# Patient Record
Sex: Female | Born: 1955
Health system: Southern US, Community
[De-identification: ages and names within clinical notes are randomized; demographics above are authoritative.]

## PROBLEM LIST (undated history)

## (undated) DIAGNOSIS — E785 Hyperlipidemia, unspecified: Secondary | ICD-10-CM

## (undated) DIAGNOSIS — M1990 Unspecified osteoarthritis, unspecified site: Secondary | ICD-10-CM

## (undated) DIAGNOSIS — E119 Type 2 diabetes mellitus without complications: Secondary | ICD-10-CM

## (undated) DIAGNOSIS — T7840XA Allergy, unspecified, initial encounter: Secondary | ICD-10-CM

## (undated) DIAGNOSIS — Z9109 Other allergy status, other than to drugs and biological substances: Secondary | ICD-10-CM

## (undated) DIAGNOSIS — I10 Essential (primary) hypertension: Secondary | ICD-10-CM

## (undated) DIAGNOSIS — K589 Irritable bowel syndrome without diarrhea: Secondary | ICD-10-CM

## (undated) DIAGNOSIS — J45909 Unspecified asthma, uncomplicated: Secondary | ICD-10-CM

## (undated) DIAGNOSIS — K219 Gastro-esophageal reflux disease without esophagitis: Secondary | ICD-10-CM

## (undated) DIAGNOSIS — E669 Obesity, unspecified: Secondary | ICD-10-CM

## (undated) HISTORY — DX: Essential (primary) hypertension: I10

## (undated) HISTORY — DX: Gastro-esophageal reflux disease without esophagitis: K21.9

## (undated) HISTORY — DX: Allergy, unspecified, initial encounter: T78.40XA

## (undated) HISTORY — DX: Irritable bowel syndrome, unspecified: K58.9

## (undated) HISTORY — PX: TUBAL LIGATION: SHX77

## (undated) HISTORY — PX: COLONOSCOPY: SHX174

## (undated) HISTORY — PX: ABDOMINAL HYSTERECTOMY: SHX81

## (undated) HISTORY — DX: Obesity, unspecified: E66.9

## (undated) HISTORY — DX: Unspecified asthma, uncomplicated: J45.909

## (undated) HISTORY — DX: Other allergy status, other than to drugs and biological substances: Z91.09

## (undated) HISTORY — DX: Hyperlipidemia, unspecified: E78.5

## (undated) HISTORY — DX: Unspecified osteoarthritis, unspecified site: M19.90

## (undated) HISTORY — DX: Type 2 diabetes mellitus without complications: E11.9

---

## 1998-03-24 ENCOUNTER — Encounter: Payer: Self-pay | Admitting: Internal Medicine

## 1998-03-24 ENCOUNTER — Ambulatory Visit (HOSPITAL_COMMUNITY): Admission: RE | Admit: 1998-03-24 | Discharge: 1998-03-24 | Payer: Self-pay | Admitting: Internal Medicine

## 1998-05-06 ENCOUNTER — Emergency Department (HOSPITAL_COMMUNITY): Admission: EM | Admit: 1998-05-06 | Discharge: 1998-05-07 | Payer: Self-pay | Admitting: Emergency Medicine

## 1998-06-15 ENCOUNTER — Other Ambulatory Visit: Admission: RE | Admit: 1998-06-15 | Discharge: 1998-06-15 | Payer: Self-pay | Admitting: Gynecology

## 1998-11-14 ENCOUNTER — Encounter: Admission: RE | Admit: 1998-11-14 | Discharge: 1999-02-12 | Payer: Self-pay | Admitting: Internal Medicine

## 1999-06-18 ENCOUNTER — Other Ambulatory Visit: Admission: RE | Admit: 1999-06-18 | Discharge: 1999-06-18 | Payer: Self-pay | Admitting: Gynecology

## 1999-06-20 ENCOUNTER — Encounter: Payer: Self-pay | Admitting: Gynecology

## 1999-06-20 ENCOUNTER — Encounter: Admission: RE | Admit: 1999-06-20 | Discharge: 1999-06-20 | Payer: Self-pay | Admitting: Gynecology

## 2000-06-18 ENCOUNTER — Other Ambulatory Visit: Admission: RE | Admit: 2000-06-18 | Discharge: 2000-06-18 | Payer: Self-pay | Admitting: Gynecology

## 2000-06-27 ENCOUNTER — Encounter: Payer: Self-pay | Admitting: Gynecology

## 2000-06-27 ENCOUNTER — Encounter: Admission: RE | Admit: 2000-06-27 | Discharge: 2000-06-27 | Payer: Self-pay | Admitting: Gynecology

## 2000-12-26 ENCOUNTER — Ambulatory Visit (HOSPITAL_COMMUNITY): Admission: RE | Admit: 2000-12-26 | Discharge: 2000-12-26 | Payer: Self-pay | Admitting: Gastroenterology

## 2001-04-23 ENCOUNTER — Encounter (INDEPENDENT_AMBULATORY_CARE_PROVIDER_SITE_OTHER): Payer: Self-pay

## 2001-04-23 ENCOUNTER — Ambulatory Visit (HOSPITAL_COMMUNITY): Admission: RE | Admit: 2001-04-23 | Discharge: 2001-04-23 | Payer: Self-pay | Admitting: Gastroenterology

## 2001-05-14 ENCOUNTER — Encounter: Admission: RE | Admit: 2001-05-14 | Discharge: 2001-05-14 | Payer: Self-pay | Admitting: Gynecology

## 2001-05-14 ENCOUNTER — Encounter: Payer: Self-pay | Admitting: Gynecology

## 2001-07-20 ENCOUNTER — Other Ambulatory Visit: Admission: RE | Admit: 2001-07-20 | Discharge: 2001-07-20 | Payer: Self-pay | Admitting: Gynecology

## 2001-09-28 ENCOUNTER — Encounter: Payer: Self-pay | Admitting: Internal Medicine

## 2001-09-28 ENCOUNTER — Encounter: Admission: RE | Admit: 2001-09-28 | Discharge: 2001-09-28 | Payer: Self-pay | Admitting: Internal Medicine

## 2002-02-24 ENCOUNTER — Emergency Department (HOSPITAL_COMMUNITY): Admission: EM | Admit: 2002-02-24 | Discharge: 2002-02-24 | Payer: Self-pay | Admitting: Nurse Practitioner

## 2002-02-24 ENCOUNTER — Encounter: Payer: Self-pay | Admitting: Emergency Medicine

## 2002-06-01 ENCOUNTER — Encounter: Admission: RE | Admit: 2002-06-01 | Discharge: 2002-06-01 | Payer: Self-pay | Admitting: Gynecology

## 2002-06-01 ENCOUNTER — Encounter: Payer: Self-pay | Admitting: Gynecology

## 2002-09-14 ENCOUNTER — Other Ambulatory Visit: Admission: RE | Admit: 2002-09-14 | Discharge: 2002-09-14 | Payer: Self-pay | Admitting: Gynecology

## 2003-06-17 ENCOUNTER — Encounter: Admission: RE | Admit: 2003-06-17 | Discharge: 2003-06-17 | Payer: Self-pay | Admitting: Gynecology

## 2003-10-04 ENCOUNTER — Other Ambulatory Visit: Admission: RE | Admit: 2003-10-04 | Discharge: 2003-10-04 | Payer: Self-pay | Admitting: Gynecology

## 2004-04-23 ENCOUNTER — Encounter: Admission: RE | Admit: 2004-04-23 | Discharge: 2004-04-23 | Payer: Self-pay | Admitting: Internal Medicine

## 2004-07-16 ENCOUNTER — Encounter: Admission: RE | Admit: 2004-07-16 | Discharge: 2004-07-16 | Payer: Self-pay | Admitting: Gynecology

## 2004-10-30 ENCOUNTER — Other Ambulatory Visit: Admission: RE | Admit: 2004-10-30 | Discharge: 2004-10-30 | Payer: Self-pay | Admitting: Gynecology

## 2005-02-09 ENCOUNTER — Emergency Department (HOSPITAL_COMMUNITY): Admission: EM | Admit: 2005-02-09 | Discharge: 2005-02-10 | Payer: Self-pay | Admitting: Emergency Medicine

## 2005-02-13 ENCOUNTER — Emergency Department (HOSPITAL_COMMUNITY): Admission: EM | Admit: 2005-02-13 | Discharge: 2005-02-14 | Payer: Self-pay | Admitting: Emergency Medicine

## 2005-03-07 ENCOUNTER — Ambulatory Visit (HOSPITAL_COMMUNITY): Admission: RE | Admit: 2005-03-07 | Discharge: 2005-03-07 | Payer: Self-pay | Admitting: Internal Medicine

## 2005-07-24 ENCOUNTER — Encounter: Admission: RE | Admit: 2005-07-24 | Discharge: 2005-07-24 | Payer: Self-pay | Admitting: Gynecology

## 2006-01-08 ENCOUNTER — Other Ambulatory Visit: Admission: RE | Admit: 2006-01-08 | Discharge: 2006-01-08 | Payer: Self-pay | Admitting: Gynecology

## 2006-07-25 ENCOUNTER — Encounter: Admission: RE | Admit: 2006-07-25 | Discharge: 2006-07-25 | Payer: Self-pay | Admitting: Gynecology

## 2007-01-13 ENCOUNTER — Other Ambulatory Visit: Admission: RE | Admit: 2007-01-13 | Discharge: 2007-01-13 | Payer: Self-pay | Admitting: Gynecology

## 2007-02-22 ENCOUNTER — Emergency Department (HOSPITAL_COMMUNITY): Admission: EM | Admit: 2007-02-22 | Discharge: 2007-02-22 | Payer: Self-pay | Admitting: Emergency Medicine

## 2007-07-17 ENCOUNTER — Emergency Department (HOSPITAL_COMMUNITY): Admission: EM | Admit: 2007-07-17 | Discharge: 2007-07-17 | Payer: Self-pay | Admitting: Emergency Medicine

## 2007-07-27 ENCOUNTER — Encounter: Admission: RE | Admit: 2007-07-27 | Discharge: 2007-07-27 | Payer: Self-pay | Admitting: Gynecology

## 2007-11-16 ENCOUNTER — Encounter: Admission: RE | Admit: 2007-11-16 | Discharge: 2007-11-16 | Payer: Self-pay | Admitting: Internal Medicine

## 2008-01-09 ENCOUNTER — Emergency Department (HOSPITAL_COMMUNITY): Admission: EM | Admit: 2008-01-09 | Discharge: 2008-01-09 | Payer: Self-pay | Admitting: Emergency Medicine

## 2008-05-04 ENCOUNTER — Other Ambulatory Visit: Admission: RE | Admit: 2008-05-04 | Discharge: 2008-05-04 | Payer: Self-pay | Admitting: Gynecology

## 2008-07-28 ENCOUNTER — Encounter: Admission: RE | Admit: 2008-07-28 | Discharge: 2008-07-28 | Payer: Self-pay | Admitting: Gynecology

## 2008-08-03 ENCOUNTER — Encounter (INDEPENDENT_AMBULATORY_CARE_PROVIDER_SITE_OTHER): Payer: Self-pay | Admitting: Obstetrics and Gynecology

## 2008-08-03 ENCOUNTER — Ambulatory Visit (HOSPITAL_COMMUNITY): Admission: RE | Admit: 2008-08-03 | Discharge: 2008-08-04 | Payer: Self-pay | Admitting: Obstetrics and Gynecology

## 2009-07-29 ENCOUNTER — Emergency Department (HOSPITAL_COMMUNITY): Admission: EM | Admit: 2009-07-29 | Discharge: 2009-07-30 | Payer: Self-pay | Admitting: Emergency Medicine

## 2009-08-02 ENCOUNTER — Encounter: Admission: RE | Admit: 2009-08-02 | Discharge: 2009-08-02 | Payer: Self-pay | Admitting: Internal Medicine

## 2010-07-21 ENCOUNTER — Other Ambulatory Visit: Payer: Self-pay | Admitting: Internal Medicine

## 2010-07-21 DIAGNOSIS — Z1239 Encounter for other screening for malignant neoplasm of breast: Secondary | ICD-10-CM

## 2010-08-17 ENCOUNTER — Ambulatory Visit: Payer: Self-pay

## 2010-08-20 ENCOUNTER — Ambulatory Visit
Admission: RE | Admit: 2010-08-20 | Discharge: 2010-08-20 | Disposition: A | Discharge status: Home / Self Care | Payer: Federal, State, Local not specified - PPO | Source: Ambulatory Visit | Attending: Internal Medicine | Admitting: Internal Medicine

## 2010-08-20 DIAGNOSIS — Z1239 Encounter for other screening for malignant neoplasm of breast: Secondary | ICD-10-CM

## 2010-09-16 LAB — DIFFERENTIAL
Basophils Absolute: 0 10*3/uL (ref 0.0–0.1)
Basophils Relative: 1 % (ref 0–1)
Eosinophils Absolute: 0 10*3/uL (ref 0.0–0.7)
Eosinophils Relative: 1 % (ref 0–5)
Lymphocytes Relative: 24 % (ref 12–46)
Lymphs Abs: 1.8 10*3/uL (ref 0.7–4.0)
Monocytes Absolute: 0.5 10*3/uL (ref 0.1–1.0)
Monocytes Relative: 7 % (ref 3–12)
Neutro Abs: 5.3 10*3/uL (ref 1.7–7.7)
Neutrophils Relative %: 69 % (ref 43–77)

## 2010-09-16 LAB — BASIC METABOLIC PANEL
BUN: 5 mg/dL — ABNORMAL LOW (ref 6–23)
CO2: 26 mEq/L (ref 19–32)
Calcium: 8.9 mg/dL (ref 8.4–10.5)
Chloride: 105 mEq/L (ref 96–112)
Creatinine, Ser: 0.83 mg/dL (ref 0.4–1.2)
GFR calc Af Amer: >60 mL/min (ref >60)
GFR calc non Af Amer: >60 mL/min (ref >60)
Glucose, Bld: 130 mg/dL — ABNORMAL HIGH (ref 70–99)
Potassium: 3.2 mEq/L — ABNORMAL LOW (ref 3.5–5.1)
Sodium: 136 mEq/L (ref 135–145)

## 2010-09-16 LAB — CBC
HCT: 36.8 % (ref 36.0–46.0)
Hemoglobin: 11.8 g/dL — ABNORMAL LOW (ref 12.0–15.0)
MCHC: 32.1 g/dL (ref 30.0–36.0)
MCV: 81.5 fL (ref 78.0–100.0)
Platelets: 321 10*3/uL (ref 150–400)
RBC: 4.51 MIL/uL (ref 3.87–5.11)
RDW: 15.7 % — ABNORMAL HIGH (ref 11.5–15.5)
WBC: 7.8 10*3/uL (ref 4.0–10.5)

## 2010-09-16 LAB — TROPONIN I: Troponin I: 0.01 ng/mL (ref 0.00–0.06)

## 2010-09-16 LAB — D-DIMER, QUANTITATIVE: D-Dimer, Quant: 0.25 ug/mL-FEU (ref 0.00–0.48)

## 2010-10-16 LAB — COMPREHENSIVE METABOLIC PANEL
ALT: 14 U/L (ref 0–35)
AST: 14 U/L (ref 0–37)
Albumin: 3.3 g/dL — ABNORMAL LOW (ref 3.5–5.2)
Alkaline Phosphatase: 77 U/L (ref 39–117)
BUN: 5 mg/dL — ABNORMAL LOW (ref 6–23)
CO2: 27 mEq/L (ref 19–32)
Calcium: 8.8 mg/dL (ref 8.4–10.5)
Chloride: 105 mEq/L (ref 96–112)
Creatinine, Ser: 0.75 mg/dL (ref 0.4–1.2)
GFR calc Af Amer: >60 mL/min (ref >60)
GFR calc non Af Amer: >60 mL/min (ref >60)
Glucose, Bld: 111 mg/dL — ABNORMAL HIGH (ref 70–99)
Potassium: 3.8 mEq/L (ref 3.5–5.1)
Sodium: 137 mEq/L (ref 135–145)
Total Bilirubin: 0.7 mg/dL (ref 0.3–1.2)
Total Protein: 7.4 g/dL (ref 6.0–8.3)

## 2010-10-16 LAB — CBC
HCT: 28.8 % — ABNORMAL LOW (ref 36.0–46.0)
HCT: 35.3 % — ABNORMAL LOW (ref 36.0–46.0)
Hemoglobin: 11.4 g/dL — ABNORMAL LOW (ref 12.0–15.0)
Hemoglobin: 9.3 g/dL — ABNORMAL LOW (ref 12.0–15.0)
MCHC: 32.2 g/dL (ref 30.0–36.0)
MCHC: 32.4 g/dL (ref 30.0–36.0)
MCV: 80.6 fL (ref 78.0–100.0)
MCV: 81.6 fL (ref 78.0–100.0)
Platelets: 290 10*3/uL (ref 150–400)
Platelets: 370 10*3/uL (ref 150–400)
RBC: 3.53 MIL/uL — ABNORMAL LOW (ref 3.87–5.11)
RBC: 4.38 MIL/uL (ref 3.87–5.11)
RDW: 16.8 % — ABNORMAL HIGH (ref 11.5–15.5)
RDW: 17.1 % — ABNORMAL HIGH (ref 11.5–15.5)
WBC: 13.6 10*3/uL — ABNORMAL HIGH (ref 4.0–10.5)
WBC: 7.9 10*3/uL (ref 4.0–10.5)

## 2010-11-13 NOTE — Op Note (Signed)
NAMEAUNNA, SNOOKS               ACCOUNT NO.:  [id number]   MEDICAL RECORD NO.:  [id number]          PATIENT TYPE:  OIB   LOCATION:  9320                          FACILITY:  [facility]   PHYSICIAN:  Dineen Kid. Rana Snare, M.D.    DATE OF BIRTH:  [birth year]   DATE OF PROCEDURE:  08/03/2008  DATE OF DISCHARGE:                               OPERATIVE REPORT   PREOPERATIVE DIAGNOSES:  Menorrhagia, dysmenorrhea, pelvic pain,  fibroids, and rectocele.   POSTOPERATIVE DIAGNOSES:  Menorrhagia, dysmenorrhea, pelvic pain,  fibroids, and rectocele.   PROCEDURE:  Laparoscopic-assisted vaginal hysterectomy, bilateral  salpingo-oophorectomy, and posterior repair with perineorrhaphy.   SURGEON:  Dineen Kid. Rana Snare, M.D.   ASSISTANT:  Marcelle Overlie, M.D.   INDICATIONS:  Ms. Sloan is a 55 year old with worsening pain, pressure  bleeding.  Ultrasound was consistent with multiple fibroids.  She also  has a symptomatic rectocele.  She desired definitive repair.  Planned  LAVH, BSO with a posterior repair.  Risks and benefits were discussed at  length.  Informed consent was obtained.  See history and physical for  further details.   FINDINGS:  At the time of surgery, grossly enlarged fibroid uterus.  Otherwise normal appearing ovaries, appendix, and liver.   DESCRIPTION OF PROCEDURE:  After adequate analgesia the patient placed  in the dorsal lithotomy position.  She is sterilely prepped and draped.  Bladder is sterilely drained.  Graves speculum was placed.  The  tenaculum is placed on the anterior lip of the cervix.  A 1-cm  infraumbilical skin incision was made.  A Veress needle was inserted.  The abdomen and was then insufflated with dullness to percussion.  An 11-  mm trocar was inserted.  The above findings were noted.  A 5-mm trocar  was inserted left to the midline, 2 fingerbreadths above the pubic  symphysis.  After careful and systematic evaluation of the abdomen and  pelvis.  A Gyrus cutting forceps  was used to identify the left round  ligament,  it was ligated and dissected.  The left infundibulopelvic  ligament was ligated and dissected down to the inferior portion of the  broad ligament.  The right round ligament and broad ligament were  dissected in the similar fashion, dissected across the infundibulopelvic  ligament.  Care was taken to avoid underlying ureter and achieving good  hemostasis.  The bladder was then elevated at the uterovesical junction.  Small bladder flap was created and the abdomen was then desufflated,  trocars removed.  The legs were repositioned.  A weighted speculum  placed in the vagina.  Posterior colpotomy was performed.  The cervix  was circumscribed with Bovie cautery.  A LigaSure instrument was used to  ligate the uterosacral ligaments bilaterally with the cardinal ligaments  bilaterally.  The Mayo scissors were used to dissect after ligating  these ligaments.  The inferior portion of the broad ligament were then  ligated with LigaSure and dissected.  The anterior peritoneum was  entered sharply and a Deaver retractor was placed.  The uterus was then  removed with ovaries and fallopian tubes intact.  The weight was 305 g.   A small ribbon pack was placed.  The uterosacral ligaments were  identified and ligated with 0 Monocryl suture in a figure-of-eight  fashion.  The posterior peritoneum was closed in pursestring fashion  with a 0 Monocryl suture.  The the vagina was then closed with figure-of-  eight 7-0 Monocryl suture in a vertical fashion.  The packing had been  removed previously to this with good support and good approximation  noted.  Two Allis clamps were placed at the level in the introitus.  A  triangular flap was made across the perineal body.  The posterior  vaginal mucosa was then undermined with Metzenbaum scissors.  Incision  made in the midportion of the vagina and the posterior vaginal mucosa  was reflected laterally.  The posterior  rectal fascia was then plicated  in midline using figure-of-eights of 0 Monocryl suture.  Excess vaginal  mucosa was removed.  The posterior vagina was then closed with a 2-0  Monocryl suture in a running fashion.  The sutures of 0 Monocryl suture  were used to approximate the superficial transverse perineal muscles and  the remaining portion of the skin on the perineal body was closed with a  2-0 Monocryl suture with good approximation and good hemostasis noted.  Good rectal tone noted and perineal support noted.  Foley catheter was  then placed with good return of clear yellow urine.  The legs  repositioned.  Abdomen reinsufflated.  A Nezhat suction irrigator was  used to irrigate the abdomen and pelvis.  Bipolar cautery was used to  cauterize the bleeding peritoneal edges and assure good hemostasis of  the pedicles, both ureters were identified with good peristalsis pulses  noted.  The abdomen was then desufflated.  Trocars removed.  The  infraumbilical skin incision was closed with a 0 Vicryl in interrupted  suture in the fascia, 3-0 Vicryl Rapide subcuticular suture.  A 5-mm  site was closed with 3-0 Vicryl Rapide subcuticular suture.  Incision  were injected with 0.4% Marcaine, total of 10 mL used.  The patient was  stable and transferred to recovery room.  Sponge and needle count was  normal x3.  Estimated blood loss 300 mL.  The patient received 1 g of  cefotetan preoperatively.      Dineen Kid Rana Snare, M.D.  Electronically Signed     DCL/MEDQ  D:  08/03/2008  T:  08/04/2008  Job:  [id number]

## 2010-11-13 NOTE — H&P (Signed)
Gloria Brooks, Gloria Brooks               ACCOUNT NO.:  [id number]   MEDICAL RECORD NO.:  [id number]          PATIENT TYPE:  AMB   LOCATION:  SDC                           [facility]ILITY:  [facility]   PHYSICIAN:  Dineen Kid. Rana Snare, M.D.    DATE OF BIRTH:  Aug 28, 1955   DATE OF ADMISSION:  DATE OF DISCHARGE:                              HISTORY & PHYSICAL   HISTORY OF PRESENT ILLNESS:  Gloria Brooks is a 55 year old G2, P2 with  problems with menorrhagia and dysmenorrhea, previously followed by Dr.  Chevis Brooks.  In the past, she had multiple D&Cs for endometrial polyps.  She  has continued to have severe dysmenorrhea, pain, and excessive bleeding.  A recent ultrasound in November shows multiple submucosal fibroids.  She  desires definitive surgical intervention and presents for hysterectomy.  She also had a previous Pap smear showing atypical glandular cells,  normal ECC.  Followup Pap smear showed reactive cells, but again, she  has had normal D&C.  She has also been having pressure symptoms and does  have a rectocele and desires repair of this as well.   PAST MEDICAL HISTORY:  Significant for hypertension and anemia.   PAST SURGERIES:  Tubal ligation and cesarean section.  She has had also  vaginal delivery and two D&Cs.   MEDICATIONS:  She is on Caduet 5/20 mg.  She is also on Nu-Iron for  anemia.   ALLERGIES:  She reported allergy to NAPROSYN, but she can take  ibuprofen.   PHYSICAL EXAMINATION:  VITAL SIGNS:  Her blood pressure is 120/90.  HEART:  Regular rate and rhythm.  LUNGS:  Clear to auscultation bilaterally.  ABDOMEN:  Soft, nontender, nondistended.  PELVIC:  She has 6- to 8-week size, mobile, nontender uterus; a fairly  narrow pelvis with no significant cystocele.  She does have a small  second-degree rectocele and decreased tone in the perineal body.   IMPRESSION AND PLAN:  Menorrhagia; dysmenorrhea, which is  incapacitating; also fibroids.  Posterior rectocele and poor perineal  support.   PLAN:  Laparoscopic-assisted vaginal hysterectomy with removal of both  tubes and ovaries.   PLAN:  Posterior repair with perineoplasty.  I discussed the procedure  at length, its risks, its benefits, which include but not limited to  risk of infection, bleeding; damage to the bowel, bladder, ureters; risk  associated with anesthesia; risk associated with blood transfusion; risk  of recurrence of the rectocele.  She does give her informed consent.  All of her questions were answered.     Dineen Kid Rana Snare, M.D.  Electronically Signed    DCL/MEDQ  D:  08/02/2008  T:  08/03/2008  Job:  [id number]

## 2010-11-13 NOTE — Discharge Summary (Signed)
Gloria Brooks, Gloria Brooks               ACCOUNT NO.:  [id number]   MEDICAL RECORD NO.:  [id number]          PATIENT TYPE:  OIB   LOCATION:  9320                          [facility]ILITY:  [facility]   PHYSICIAN:  Dineen Kid. Rana Snare, M.D.    DATE OF BIRTH:  1955-09-22   DATE OF ADMISSION:  08/03/2008  DATE OF DISCHARGE:  08/04/2008                               DISCHARGE SUMMARY   HISTORY OF PRESENT ILLNESS:  Ms. Hinote is a 55 year old G2, P2 with  worsening problems of menorrhagia, dysmenorrhea with multiple D&C in the  past for endometrial polyps continues to have severe dysmenorrhea, pain,  excessive bleeding.  Ultrasound shows multiple submucosal fibroids.  She  desires definitive surgical intervention and presents for hysterectomy.  She also has a symptomatic rectocele, desires to appear with this in the  same time.   HOSPITAL COURSE:  The patient underwent laparoscopic-assisted vaginal  hysterectomy with bilateral salpingo-oophorectomy also posterior  colporrhaphies.  Procedure was uncomplicated with estimated blood loss  of 300 mL.  Her postoperative care was uncomplicated as well.  By  postoperative day #1, she was ambulating, tolerating regular diets, able  to pass flatus.  Postoperative hemoglobin was 9.3.  Her abdomen is soft,  nontender, nondistended with normoactive bowel sounds.  Incisions clean,  dry, and intact.  The patient was discharged home.   DISPOSITION:  This patient will be discharged home.  Followup in the  office 2-3 weeks.  Given the routine instruction sheet for hysterectomy.  Told to return for increased pain, fever, or bleeding.  Signed a  prescription for Tylox #30.      Dineen Kid Rana Snare, M.D.  Electronically Signed     DCL/MEDQ  D:  08/04/2008  T:  08/04/2008  Job:  [id number]

## 2010-11-22 ENCOUNTER — Emergency Department (HOSPITAL_COMMUNITY)
Admission: EM | Admit: 2010-11-22 | Discharge: 2010-11-22 | Disposition: A | Discharge status: Home / Self Care | Payer: No Typology Code available for payment source | Attending: General Surgery | Admitting: General Surgery

## 2010-11-22 DIAGNOSIS — R51 Headache: Secondary | ICD-10-CM | POA: Insufficient documentation

## 2010-11-22 DIAGNOSIS — M62838 Other muscle spasm: Secondary | ICD-10-CM | POA: Insufficient documentation

## 2010-11-22 DIAGNOSIS — Z79899 Other long term (current) drug therapy: Secondary | ICD-10-CM | POA: Insufficient documentation

## 2010-11-22 DIAGNOSIS — S0990XA Unspecified injury of head, initial encounter: Secondary | ICD-10-CM | POA: Insufficient documentation

## 2010-11-22 DIAGNOSIS — F411 Generalized anxiety disorder: Secondary | ICD-10-CM | POA: Insufficient documentation

## 2010-11-22 DIAGNOSIS — E785 Hyperlipidemia, unspecified: Secondary | ICD-10-CM | POA: Insufficient documentation

## 2010-11-22 DIAGNOSIS — I10 Essential (primary) hypertension: Secondary | ICD-10-CM | POA: Insufficient documentation

## 2010-11-22 DIAGNOSIS — M546 Pain in thoracic spine: Secondary | ICD-10-CM | POA: Insufficient documentation

## 2011-03-28 LAB — URINALYSIS, ROUTINE W REFLEX MICROSCOPIC
Bilirubin Urine: NEGATIVE
Glucose, UA: NEGATIVE
Ketones, ur: 15 — AB
Nitrite: NEGATIVE
Protein, ur: >300 — AB
Specific Gravity, Urine: 1.028
Urobilinogen, UA: 1
pH: 6

## 2011-03-28 LAB — URINE MICROSCOPIC-ADD ON

## 2011-07-15 ENCOUNTER — Other Ambulatory Visit: Payer: Self-pay | Admitting: Internal Medicine

## 2011-07-15 DIAGNOSIS — Z1231 Encounter for screening mammogram for malignant neoplasm of breast: Secondary | ICD-10-CM

## 2011-07-21 ENCOUNTER — Ambulatory Visit (INDEPENDENT_AMBULATORY_CARE_PROVIDER_SITE_OTHER): Payer: Federal, State, Local not specified - PPO

## 2011-07-21 DIAGNOSIS — Q828 Other specified congenital malformations of skin: Secondary | ICD-10-CM

## 2011-07-21 DIAGNOSIS — R05 Cough: Secondary | ICD-10-CM

## 2011-07-21 DIAGNOSIS — J019 Acute sinusitis, unspecified: Secondary | ICD-10-CM

## 2011-07-21 DIAGNOSIS — E669 Obesity, unspecified: Secondary | ICD-10-CM

## 2011-07-21 DIAGNOSIS — R059 Cough, unspecified: Secondary | ICD-10-CM

## 2011-08-26 ENCOUNTER — Ambulatory Visit
Admission: RE | Admit: 2011-08-26 | Discharge: 2011-08-26 | Disposition: A | Discharge status: Home / Self Care | Payer: Federal, State, Local not specified - PPO | Source: Ambulatory Visit | Attending: Internal Medicine | Admitting: Internal Medicine

## 2011-08-26 DIAGNOSIS — Z1231 Encounter for screening mammogram for malignant neoplasm of breast: Secondary | ICD-10-CM

## 2011-10-23 ENCOUNTER — Ambulatory Visit (INDEPENDENT_AMBULATORY_CARE_PROVIDER_SITE_OTHER): Payer: Federal, State, Local not specified - PPO | Admitting: Internal Medicine

## 2011-10-23 VITALS — BP 151/88 | HR 78 | Temp 98.0°F | Resp 18 | Ht 67.0 in | Wt 215.0 lb

## 2011-10-23 DIAGNOSIS — J029 Acute pharyngitis, unspecified: Secondary | ICD-10-CM

## 2011-10-23 DIAGNOSIS — I10 Essential (primary) hypertension: Secondary | ICD-10-CM

## 2011-10-23 DIAGNOSIS — J309 Allergic rhinitis, unspecified: Secondary | ICD-10-CM

## 2011-10-23 LAB — POCT RAPID STREP A (OFFICE): Rapid Strep A Screen: NEGATIVE

## 2011-10-23 MED ORDER — CEFDINIR 300 MG PO CAPS: 300.0000 mg | ORAL_CAPSULE | Freq: Two times a day (BID) | ORAL | Status: AC

## 2011-10-23 MED ORDER — FLUCONAZOLE 150 MG PO TABS: 150.0000 mg | ORAL_TABLET | Freq: Once | ORAL | Status: AC

## 2011-10-23 MED ORDER — MOMETASONE FUROATE 50 MCG/ACT NA SUSP: 2.0000 | Freq: Every day | NASAL | Status: DC

## 2011-10-23 NOTE — Progress Notes (Signed)
  Subjective:    Patient ID: Gloria Brooks, female    DOB: 1955-11-24, 56 y.o.   MRN: [id number]  Sore Throat  This is a new problem. The current episode started in the past 7 days. The problem has been unchanged. There has been no fever. The pain is moderate. Associated symptoms include congestion, coughing and a plugged ear sensation. Pertinent negatives include no ear pain, headaches, shortness of breath, trouble swallowing or vomiting. She has tried nothing for the symptoms.  Alisandra is a 56 year old AA here with a 3 day history of sore throat and rhinitis.  She started her antihistamine over the weekend but has continued to have a very sore throat.  She is on Caduet which she has taken today, tells me her BP is low sometimes when she checks it at [facility], she has regular visits with her PCP.  She denies recent fever, dizziness, vomiting or other systemic symptoms.    Review of Systems  HENT: Positive for congestion. Negative for ear pain and trouble swallowing.   Respiratory: Positive for cough. Negative for shortness of breath.   Gastrointestinal: Negative for vomiting.  Neurological: Negative for headaches.  All other systems reviewed and are negative.  Negative except as noted in HPI     Objective:   Physical Exam  Vitals reviewed. Constitutional: She is oriented to person, place, and time. She appears well-developed and well-nourished.  HENT:  Head: Normocephalic and atraumatic.  Right Ear: External ear normal.  Left Ear: External ear normal.  Mouth/Throat: No oropharyngeal exudate.       Oropharynx with some red spots on soft palate (no ulcers visualized but limited exam secondary to pt strong gag reflex(.  Left TM injected  Neck: Neck supple. No thyromegaly present.  Cardiovascular: Normal rate, regular rhythm and normal heart sounds.        BP is 150/86 left arm large cuff  Pulmonary/Chest: Effort normal and breath sounds normal. She has no wheezes. She has no rales. She  exhibits no tenderness.  Abdominal: Soft.  Lymphadenopathy:    She has no cervical adenopathy.  Neurological: She is alert and oriented to person, place, and time.  Skin: Skin is warm and dry.  Psychiatric: She has a normal mood and affect. Her behavior is normal.          Assessment & Plan:  Early sinusitis:  Omnicef 300 mg BID for 7 days.  Nasonex nasal spray prn.  Given script for Magic Mouthwash to use for throat discomfort.   HTN:  Not to goal, advised pt to recheck in a few days and follow-up with her PCP if systolic remains around 150, she agrees.  AVS printed and given pt.

## 2011-10-23 NOTE — Patient Instructions (Signed)
TAke your antibiotic and use your magic mouthwash as needed for throat pain.  Be sure and stay on your antihistamine (Zyrtec or Claritin) and use your nasonex everyday to help keep your sinus symptoms/allergy symptoms under control.  Sinusitis Sinuses are air pockets within the bones of your face. The growth of bacteria within a sinus leads to infection. The infection prevents the sinuses from draining. This infection is called sinusitis. SYMPTOMS  There will be different areas of pain depending on which sinuses have become infected.  The maxillary sinuses often produce pain beneath the eyes.   Frontal sinusitis may cause pain in the middle of the forehead and above the eyes.  Other problems (symptoms) include:  Toothaches.   Colored, pus-like (purulent) drainage from the nose.   Swelling, warmth, and tenderness over the sinus areas may be signs of infection.  TREATMENT  Sinusitis is most often determined by an exam.X-rays may be taken. If x-rays have been taken, make sure you obtain your results or find out how you are to obtain them. Your caregiver may give you medications (antibiotics). These are medications that will help kill the bacteria causing the infection. You may also be given a medication (decongestant) that helps to reduce sinus swelling.  HOME CARE INSTRUCTIONS   Only take over-the-counter or prescription medicines for pain, discomfort, or fever as directed by your caregiver.   Drink extra fluids. Fluids help thin the mucus so your sinuses can drain more easily.   Applying either moist heat or ice packs to the sinus areas may help relieve discomfort.   Use saline nasal sprays to help moisten your sinuses. The sprays can be found at your local drugstore.  SEEK IMMEDIATE MEDICAL CARE IF:  You have a fever.   You have increasing pain, severe headaches, or toothache.   You have nausea, vomiting, or drowsiness.   You develop unusual swelling around the face or trouble  seeing.  MAKE SURE YOU:   Understand these instructions.   Will watch your condition.   Will get help right away if you are not doing well or get worse.  Document Released: 06/17/2005 Document Revised: 06/06/2011 Document Reviewed: 01/14/2007 [facility] Patient Information 2012 [place], [place].

## 2012-01-24 ENCOUNTER — Ambulatory Visit (INDEPENDENT_AMBULATORY_CARE_PROVIDER_SITE_OTHER): Payer: Federal, State, Local not specified - PPO | Admitting: Physician Assistant

## 2012-01-24 VITALS — BP 132/80 | HR 72 | Temp 98.9°F | Resp 18 | Ht 67.0 in | Wt 218.0 lb

## 2012-01-24 DIAGNOSIS — R51 Headache: Secondary | ICD-10-CM

## 2012-01-24 DIAGNOSIS — E669 Obesity, unspecified: Secondary | ICD-10-CM | POA: Insufficient documentation

## 2012-01-24 DIAGNOSIS — R519 Headache, unspecified: Secondary | ICD-10-CM

## 2012-01-24 DIAGNOSIS — R3915 Urgency of urination: Secondary | ICD-10-CM

## 2012-01-24 LAB — POCT URINALYSIS DIPSTICK
Bilirubin, UA: NEGATIVE
Blood, UA: NEGATIVE
Glucose, UA: NEGATIVE
Spec Grav, UA: 1.02
Urobilinogen, UA: 0.2

## 2012-01-24 LAB — POCT CBC
HCT, POC: 43.7 % (ref 37.7–47.9)
Hemoglobin: 12.9 g/dL (ref 12.2–16.2)
Lymph, poc: 2.7 (ref 0.6–3.4)
MCH, POC: 25.4 pg — AB (ref 27–31.2)
MCHC: 29.5 g/dL — AB (ref 31.8–35.4)
MPV: 9.1 fL (ref 0–99.8)
POC Granulocyte: 5.7 (ref 2–6.9)
POC LYMPH PERCENT: 30 %L (ref 10–50)
POC MID %: 6.4 %M (ref 0–12)
RDW, POC: 15.5 %
WBC: 8.9 10*3/uL (ref 4.6–10.2)

## 2012-01-24 LAB — POCT UA - MICROSCOPIC ONLY: Mucus, UA: POSITIVE

## 2012-01-24 MED ORDER — SULFAMETHOXAZOLE-TRIMETHOPRIM 800-160 MG PO TABS: 1.0000 | ORAL_TABLET | Freq: Two times a day (BID) | ORAL | Status: AC

## 2012-01-24 MED ORDER — CYCLOBENZAPRINE HCL 10 MG PO TABS: 10.0000 mg | ORAL_TABLET | Freq: Three times a day (TID) | ORAL | Status: AC | PRN

## 2012-01-24 NOTE — Progress Notes (Signed)
Subjective:    Patient ID: Gloria Brooks, female    DOB: 1955/07/05, 56 y.o.   MRN: [id number]  HPI This 56 y.o. Female presents for "a sinus infection."  Patient describes headache for a week.  The pain began in the back of her neck, then extended to cover her whole head and her teeth.  Neck feels stiff. Ear fullness, and pressure. Has had chills.  No nasal congestion, drainage.  No sore throat.  No cough.   No GI symptoms.  Mild urgency above her normal.  Had tried no products to alleviate her symptoms.  No aggravating factors. She rates her pain 3-4/10 now, was 9/10 while in our waiting room.  Legs feel heavy, but not weak.  No giving way, falls.  No paresthesias.  No dizziness, vision change, photo/phonophobia.  Review of Systems As above.   Past Medical History  Diagnosis Date  . Allergy   . Hypertension   . Hyperlipidemia   . Obesity   . Anemia     Past Surgical History  Procedure Date  . Abdominal hysterectomy   . Cesarean section   . Tubal ligation     Prior to Admission medications   Medication Sig Start Date End Date Taking? Authorizing Provider  amLODipine-atorvastatin (CADUET) 5-20 MG per tablet Take 1 tablet by mouth daily.   Yes Historical Provider, MD  aspirin 81 MG tablet Take 81 mg by mouth daily.   Yes Historical Provider, MD  cetirizine (ZYRTEC) 10 MG tablet Take 10 mg by mouth daily.    Historical Provider, MD  mometasone (NASONEX) 50 MCG/ACT nasal spray Place 2 sprays into the nose daily. 10/23/11   Rickard Patience, PA-C    No Known Allergies  History   Social History  . Marital Status: Divorced    Spouse Name: N/A    Number of Children: 2  . Years of Education: 17   Occupational History  . Personnel Specialist [employer]   Social History Main Topics  . Smoking status: Never Smoker   . Smokeless tobacco: Never Used  . Alcohol Use: No  . Drug Use: No  . Sexually Active: Not Currently    Birth Control/ Protection: Surgical   Hyterectomy    Family History  Problem Relation Age of Onset  . Hypertension Mother   . Arthritis Mother   . Hyperlipidemia Mother   . Diabetes Father   . Heart disease Father   . Obesity Sister   . Myocarditis Sister        Objective:   Physical Exam Blood pressure 132/80, pulse 72, temperature 98.9 F (37.2 C), temperature source Oral, resp. rate 18, height 5\' 7"  (1.702 m), weight 218 lb (98.884 kg), SpO2 100.00%. Body mass index is 34.14 kg/(m^2). Well-developed, well nourished BF who is awake, alert and oriented, in NAD. HEENT: [place]/AT, PERRL, EOMI.  Sclera and conjunctiva are clear.  Funduscopic exam is normal bilaterally. EAC are patent, TMs are normal in appearance. Nasal mucosa is pink and moist. OP is clear. Neck: supple, non-tender, no lymphadenopathy, thyromegaly. Heart: RRR, no murmur Lungs: CTA Abdomen: normo-active bowel sounds, supple, non-tender, no mass or organomegaly. Extremities: no cyanosis, clubbing or edema. Skin: warm and dry without rash. Neurological:  CNII-VII intact, normal strength, DTRs are symmetrically strong.  Normal coordination, sensation.  Results for orders placed in visit on 01/24/12  POCT UA - MICROSCOPIC ONLY      Component Value Range   WBC, Ur, HPF, POC 11-21  RBC, urine, microscopic 1-2     Bacteria, U Microscopic 1+     Mucus, UA pos     Epithelial cells, urine per micros 0-3     Crystals, Ur, HPF, POC neg     Casts, Ur, LPF, POC neg     Yeast, UA neg    POCT URINALYSIS DIPSTICK      Component Value Range   Color, UA yellow     Clarity, UA clear     Glucose, UA neg     Bilirubin, UA neg     Ketones, UA neg     Spec Grav, UA 1.020     Blood, UA neg     pH, UA 6.0     Protein, UA neg     Urobilinogen, UA 0.2     Nitrite, UA neg     Leukocytes, UA large (3+)    POCT CBC      Component Value Range   WBC 8.9  4.6 - 10.2 K/uL   Lymph, poc 2.7  0.6 - 3.4   POC LYMPH PERCENT 30.0  10 - 50 %L   MID (cbc) 0.6  0 - 0.9    POC MID % 6.4  0 - 12 %M   POC Granulocyte 5.7  2 - 6.9   Granulocyte percent 63.6  37 - 80 %G   RBC 5.07  4.04 - 5.48 M/uL   Hemoglobin 12.9  12.2 - 16.2 g/dL   HCT, POC 40.9  81.1 - 47.9 %   MCV 86.2  80 - 97 fL   MCH, POC 25.4 (*) 27 - 31.2 pg   MCHC 29.5 (*) 31.8 - 35.4 g/dL   RDW, POC 91.4     Platelet Count, POC 425 (*) 142 - 424 K/uL   MPV 9.1  0 - 99.8 fL       Assessment & Plan:   1. HA (headache), likely tension, possibly atypical sinusitis or UTI  cyclobenzaprine (FLEXERIL) 10 MG tablet  2. Urinary urgency, possible UTI  POCT UA - Microscopic Only, POCT urinalysis dipstick, POCT CBC, Urine culture, sulfamethoxazole-trimethoprim (BACTRIM DS,SEPTRA DS) 800-160 MG per tablet   Acetaminophen as needed.  Rest.  Reviewed red flags that should prompt RTC or ED evaluation.

## 2012-01-26 LAB — URINE CULTURE: Colony Count: 45000

## 2012-01-30 ENCOUNTER — Telehealth: Payer: Self-pay

## 2012-01-30 NOTE — Telephone Encounter (Signed)
Called pt for further information. She was given meds for UTI and was given flexeril for facial pressure, need to know if this is worsening.

## 2012-01-30 NOTE — Telephone Encounter (Signed)
The patient called to request new rx for sinus infection.  The patient stated she is continuing to have headache, facial and teeth pain and pressure.  Please call the patient at [phone].

## 2012-01-31 NOTE — Telephone Encounter (Signed)
LMOM for patient to return call.

## 2012-02-02 MED ORDER — AMOXICILLIN 875 MG PO TABS: 875.0000 mg | ORAL_TABLET | Freq: Two times a day (BID) | ORAL | Status: AC

## 2012-02-02 NOTE — Telephone Encounter (Signed)
Spoke with pt she just feels dry and still having headaches and it comes and goes. Can we call in Rx for sinus infection? RTC? Please advise

## 2012-02-02 NOTE — Telephone Encounter (Signed)
Abx sent in - I would also take some Mucinex to help get out the congestion.

## 2012-02-02 NOTE — Telephone Encounter (Signed)
LMOM ABX sent in.

## 2012-05-10 ENCOUNTER — Ambulatory Visit (INDEPENDENT_AMBULATORY_CARE_PROVIDER_SITE_OTHER): Payer: Federal, State, Local not specified - PPO | Admitting: Emergency Medicine

## 2012-05-10 VITALS — BP 148/86 | HR 77 | Temp 98.5°F | Resp 17 | Ht 66.0 in | Wt 224.0 lb

## 2012-05-10 DIAGNOSIS — J329 Chronic sinusitis, unspecified: Secondary | ICD-10-CM

## 2012-05-10 DIAGNOSIS — J309 Allergic rhinitis, unspecified: Secondary | ICD-10-CM

## 2012-05-10 DIAGNOSIS — J029 Acute pharyngitis, unspecified: Secondary | ICD-10-CM

## 2012-05-10 DIAGNOSIS — B49 Unspecified mycosis: Secondary | ICD-10-CM

## 2012-05-10 DIAGNOSIS — B379 Candidiasis, unspecified: Secondary | ICD-10-CM

## 2012-05-10 LAB — POCT RAPID STREP A (OFFICE): Rapid Strep A Screen: NEGATIVE

## 2012-05-10 MED ORDER — AMOXICILLIN-POT CLAVULANATE 875-125 MG PO TABS: 1.0000 | ORAL_TABLET | Freq: Two times a day (BID) | ORAL | Status: DC

## 2012-05-10 MED ORDER — FLUCONAZOLE 150 MG PO TABS: 150.0000 mg | ORAL_TABLET | Freq: Once | ORAL | Status: DC

## 2012-05-10 MED ORDER — PREDNISONE 10 MG PO TABS: ORAL_TABLET | ORAL | Status: DC

## 2012-05-10 NOTE — Patient Instructions (Signed)

## 2012-05-10 NOTE — Progress Notes (Signed)
  Subjective:    Patient ID: Gloria Brooks, female    DOB: 30-May-1956, 56 y.o.   MRN: [id number]  HPI Pt presents to clinic today with congestion, facial pain, cough, sore throat that started about 10 days ago. She states she has been in and out of the hospital and nursing home visiting a loved one who just died of stomach cancer. She has a long history of allergies and has problems this time a year every year. She is on multiple allergy medications and allergy shots. She's had yellowish drainage from her nose and also coughing up yellowish phlegm .  Review of Systems     Objective:   Physical Exam HEENT exam is unremarkable except for significant nasal congestion and tenderness over both maxillary sinuses. The posterior pharynx is red and inflamed. Neck is supple. There is no adenopathy noted chest is clear to both auscultation and percussion.  Results for orders placed in visit on 05/10/12  POCT RAPID STREP A (OFFICE)      Component Value Range   Rapid Strep A Screen Negative  Negative        Assessment & Plan:  We'll check a rapid strep today. It appears the patient has allergic rhinitis with secondary sinusitis . She'll continue her Flonase she is given a 6 day taper of prednisone along with Augmentin 875 twice a day for 10 days

## 2012-05-27 ENCOUNTER — Telehealth: Payer: Self-pay

## 2012-05-27 NOTE — Telephone Encounter (Signed)
Called pt to advise

## 2012-05-27 NOTE — Telephone Encounter (Signed)
Called patient she is using Nasonex and Allegra still persists with large amount of nasal drainage, post nasal drip,sore throat please advise.

## 2012-05-27 NOTE — Telephone Encounter (Signed)
Recommend Mucinex as directed to help with congestion.  Try this for several days and then let us know if no improvement

## 2012-05-27 NOTE — Telephone Encounter (Signed)
Add Atrovent Nasal Spray, 2 sprays in each nostril BID (use this before the Nasonex is she uses them at the same time).  If her symptoms persist, she needs Re-evaluation.

## 2012-05-27 NOTE — Telephone Encounter (Signed)
Advised pt to try mucinex and she reported that she is allergic to it and it makes her throat swell. Is there anything else you can suggest for the nasal drainage/PND? She is currently taking Nasonex and Allegra.

## 2012-05-27 NOTE — Telephone Encounter (Signed)
Left message for call back.

## 2012-05-27 NOTE — Telephone Encounter (Signed)
PT STATES THAT SHE IS STILL EXPERIENCING THROAT PAIN FROM DRAINAGE AND RUNNY NOSE, AND FREQUENTLY COLD AND HOT. PT STATES THAT SHE HAS COMPLETED THE AMOXICILLIN AND PREDNISONE THAT SHE WAS PRESCRIBED. PLEASE ADVISE. [phone] PHARMACY: [facility] [address]

## 2012-07-20 ENCOUNTER — Other Ambulatory Visit: Payer: Self-pay | Admitting: Internal Medicine

## 2012-07-20 DIAGNOSIS — Z1231 Encounter for screening mammogram for malignant neoplasm of breast: Secondary | ICD-10-CM

## 2012-08-28 ENCOUNTER — Ambulatory Visit: Payer: Federal, State, Local not specified - PPO

## 2012-09-14 ENCOUNTER — Inpatient Hospital Stay: Admission: RE | Admit: 2012-09-14 | Payer: Federal, State, Local not specified - PPO | Source: Ambulatory Visit

## 2012-09-14 ENCOUNTER — Ambulatory Visit: Payer: Federal, State, Local not specified - PPO

## 2012-09-25 ENCOUNTER — Ambulatory Visit
Admission: RE | Admit: 2012-09-25 | Discharge: 2012-09-25 | Disposition: A | Discharge status: Home / Self Care | Payer: Federal, State, Local not specified - PPO | Source: Ambulatory Visit | Attending: Internal Medicine | Admitting: Internal Medicine

## 2012-09-25 DIAGNOSIS — Z1231 Encounter for screening mammogram for malignant neoplasm of breast: Secondary | ICD-10-CM

## 2013-01-29 ENCOUNTER — Other Ambulatory Visit: Payer: Self-pay | Admitting: Internal Medicine

## 2013-01-29 DIAGNOSIS — R109 Unspecified abdominal pain: Secondary | ICD-10-CM

## 2013-01-30 ENCOUNTER — Emergency Department (HOSPITAL_COMMUNITY)
Admission: EM | Admit: 2013-01-30 | Discharge: 2013-01-30 | Disposition: A | Discharge status: Home / Self Care | Payer: No Typology Code available for payment source | Attending: Emergency Medicine | Admitting: Emergency Medicine

## 2013-01-30 ENCOUNTER — Encounter (HOSPITAL_COMMUNITY): Payer: Self-pay | Admitting: *Deleted

## 2013-01-30 DIAGNOSIS — E785 Hyperlipidemia, unspecified: Secondary | ICD-10-CM | POA: Diagnosis not present

## 2013-01-30 DIAGNOSIS — Z79899 Other long term (current) drug therapy: Secondary | ICD-10-CM | POA: Insufficient documentation

## 2013-01-30 DIAGNOSIS — S298XXA Other specified injuries of thorax, initial encounter: Secondary | ICD-10-CM | POA: Diagnosis present

## 2013-01-30 DIAGNOSIS — S161XXA Strain of muscle, fascia and tendon at neck level, initial encounter: Secondary | ICD-10-CM

## 2013-01-30 DIAGNOSIS — S0990XA Unspecified injury of head, initial encounter: Secondary | ICD-10-CM | POA: Insufficient documentation

## 2013-01-30 DIAGNOSIS — I10 Essential (primary) hypertension: Secondary | ICD-10-CM | POA: Insufficient documentation

## 2013-01-30 DIAGNOSIS — Y9389 Activity, other specified: Secondary | ICD-10-CM | POA: Insufficient documentation

## 2013-01-30 DIAGNOSIS — E669 Obesity, unspecified: Secondary | ICD-10-CM | POA: Diagnosis not present

## 2013-01-30 DIAGNOSIS — S3981XA Other specified injuries of abdomen, initial encounter: Secondary | ICD-10-CM | POA: Diagnosis not present

## 2013-01-30 DIAGNOSIS — Y92410 Unspecified street and highway as the place of occurrence of the external cause: Secondary | ICD-10-CM | POA: Insufficient documentation

## 2013-01-30 DIAGNOSIS — Z8709 Personal history of other diseases of the respiratory system: Secondary | ICD-10-CM | POA: Insufficient documentation

## 2013-01-30 DIAGNOSIS — Z8739 Personal history of other diseases of the musculoskeletal system and connective tissue: Secondary | ICD-10-CM | POA: Diagnosis not present

## 2013-01-30 DIAGNOSIS — S139XXA Sprain of joints and ligaments of unspecified parts of neck, initial encounter: Secondary | ICD-10-CM | POA: Insufficient documentation

## 2013-01-30 LAB — POCT I-STAT, CHEM 8
Calcium, Ion: 1.23 mmol/L (ref 1.12–1.23)
Calcium, Ion: 1.24 mmol/L — ABNORMAL HIGH (ref 1.12–1.23)
Creatinine, Ser: 1 mg/dL (ref 0.50–1.10)
Glucose, Bld: 149 mg/dL — ABNORMAL HIGH (ref 70–99)
Glucose, Bld: 149 mg/dL — ABNORMAL HIGH (ref 70–99)
HCT: 39 % (ref 36.0–46.0)
Hemoglobin: 13.3 g/dL (ref 12.0–15.0)
Hemoglobin: 13.3 g/dL (ref 12.0–15.0)
TCO2: 24 mmol/L (ref 0–100)
TCO2: 25 mmol/L (ref 0–100)

## 2013-01-30 MED ORDER — DIAZEPAM 5 MG PO TABS: 5.0000 mg | ORAL_TABLET | Freq: Four times a day (QID) | ORAL | Status: DC | PRN

## 2013-01-30 MED ORDER — OXYCODONE-ACETAMINOPHEN 5-325 MG PO TABS: 1.0000 | ORAL_TABLET | Freq: Four times a day (QID) | ORAL | Status: DC | PRN

## 2013-01-30 MED ORDER — ACETAMINOPHEN 325 MG PO TABS
650.0000 mg | ORAL_TABLET | Freq: Once | ORAL | Status: AC
  Administered 2013-01-30: 650 mg via ORAL
  Filled 2013-01-30: qty 2
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
## 2013-01-30 NOTE — ED Notes (Signed)
Dr. Rubin Payor removed C-Collar after physical exam.
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
## 2013-08-18 ENCOUNTER — Other Ambulatory Visit: Payer: Self-pay

## 2013-08-18 DIAGNOSIS — Z1231 Encounter for screening mammogram for malignant neoplasm of breast: Secondary | ICD-10-CM

## 2013-09-27 ENCOUNTER — Ambulatory Visit
Admission: RE | Admit: 2013-09-27 | Discharge: 2013-09-27 | Disposition: A | Discharge status: Home / Self Care | Payer: Federal, State, Local not specified - PPO | Source: Ambulatory Visit

## 2013-09-27 DIAGNOSIS — Z1231 Encounter for screening mammogram for malignant neoplasm of breast: Secondary | ICD-10-CM

## 2014-02-07 ENCOUNTER — Other Ambulatory Visit: Payer: Self-pay | Admitting: Gynecology

## 2014-02-09 LAB — CYTOLOGY - PAP

## 2014-03-17 ENCOUNTER — Ambulatory Visit: Payer: Federal, State, Local not specified - PPO | Admitting: Orthopedic Surgery

## 2014-08-18 ENCOUNTER — Ambulatory Visit (INDEPENDENT_AMBULATORY_CARE_PROVIDER_SITE_OTHER): Payer: Federal, State, Local not specified - PPO | Admitting: Orthopedic Surgery

## 2014-08-18 ENCOUNTER — Encounter: Payer: Self-pay | Admitting: Orthopedic Surgery

## 2014-08-18 VITALS — BP 138/82 | Ht 66.0 in | Wt 209.0 lb

## 2014-08-18 DIAGNOSIS — G5602 Carpal tunnel syndrome, left upper limb: Secondary | ICD-10-CM

## 2014-08-18 DIAGNOSIS — G5601 Carpal tunnel syndrome, right upper limb: Secondary | ICD-10-CM

## 2014-08-18 DIAGNOSIS — G5603 Carpal tunnel syndrome, bilateral upper limbs: Secondary | ICD-10-CM

## 2014-08-18 NOTE — Patient Instructions (Signed)
Wear splints at night  Take Vitamin B6  Change Keyboard and chair at work

## 2014-08-22 ENCOUNTER — Other Ambulatory Visit: Payer: Self-pay

## 2014-08-22 ENCOUNTER — Encounter: Payer: Self-pay | Admitting: Orthopedic Surgery

## 2014-08-22 DIAGNOSIS — Z1231 Encounter for screening mammogram for malignant neoplasm of breast: Secondary | ICD-10-CM

## 2014-08-22 NOTE — Progress Notes (Signed)
Subjective:   Chief Complaint  Patient presents with  . Hand Problem    bilateral hand/wrist pain     Gloria Brooks is a 59 y.o. female who presents with pain in hands, hand paresthesias and possible carpal tunnel syndrome. Onset of the symptoms was 2 yrs ago. Current symptoms include pain involving the hands and tingling/numbness involving the hands, median nerve distribution. Aggravating factors: denies target symptoms of hypothyroidism, repetitive activity: typing, work related keyboarding, work related repetitive activity: &, worse at night or first thing in the morning and worse with activity. Symptoms have gradually worsened. Evaluation to date: none. Treatment to date: wrist splints used for the last 2 years intermittently &, which has been somewhat effective.  The following portions of the patient's history were reviewed and updated as appropriate: allergies, current medications, past family history, past medical history, past social history, past surgical history and problem list.  Review of Systems A comprehensive review of systems was negative except for: Constitutional: positive for chills Ears, nose, mouth, throat, and face: positive for nasal congestion and sore throat Gastrointestinal: positive for diarrhea, nausea and vomiting Neurological: positive for see hp    Objective:    BP 138/82 mmHg  Ht 5\' 6"  (1.676 m)  Wt 209 lb (94.802 kg)  BMI 33.75 kg/m2 Right wrist:  carpal tunnel compression test is negative, no thenar or hypothenar atrophy, Phalen's: negative, sensation diminished on the Volar aspect of the hand, strength normal, swelling is not present, synovitis is not present, Tinel's sign: negative and wrist has normal range of motion  Left wrist:  carpal tunnel compression test is negative, no thenar or hypothenar atrophy, Phalen's: negative, sensation diminished on the Volar aspect of the hand, strength normal, swelling is not present, synovitis is not present, Tinel's  sign: negative and wrist has normal range of motion     Assessment:    Carpal tunnel syndrome    Plan:    Vitamin B 600 mg twice a day full-time nighttime splinting keyboard ergonomic workstation changes the chair follow-up 6 weeks

## 2014-08-31 ENCOUNTER — Telehealth: Payer: Self-pay | Admitting: *Deleted

## 2014-08-31 NOTE — Telephone Encounter (Signed)
Need script for under desk mounted tray for keyboard.

## 2014-09-01 NOTE — Telephone Encounter (Signed)
Mailed script to patient

## 2014-09-30 ENCOUNTER — Other Ambulatory Visit: Payer: Self-pay

## 2014-09-30 ENCOUNTER — Ambulatory Visit
Admission: RE | Admit: 2014-09-30 | Discharge: 2014-09-30 | Disposition: A | Discharge status: Home / Self Care | Payer: Federal, State, Local not specified - PPO | Source: Ambulatory Visit

## 2014-09-30 DIAGNOSIS — Z1231 Encounter for screening mammogram for malignant neoplasm of breast: Secondary | ICD-10-CM

## 2014-10-06 ENCOUNTER — Ambulatory Visit: Payer: Federal, State, Local not specified - PPO | Admitting: Orthopedic Surgery

## 2014-10-27 ENCOUNTER — Ambulatory Visit (INDEPENDENT_AMBULATORY_CARE_PROVIDER_SITE_OTHER): Payer: Federal, State, Local not specified - PPO | Admitting: Orthopedic Surgery

## 2014-10-27 ENCOUNTER — Encounter: Payer: Self-pay | Admitting: Orthopedic Surgery

## 2014-10-27 VITALS — BP 125/78 | Ht 66.0 in | Wt 209.0 lb

## 2014-10-27 DIAGNOSIS — G5603 Carpal tunnel syndrome, bilateral upper limbs: Secondary | ICD-10-CM

## 2014-10-27 DIAGNOSIS — G5601 Carpal tunnel syndrome, right upper limb: Secondary | ICD-10-CM | POA: Diagnosis not present

## 2014-10-27 DIAGNOSIS — G5602 Carpal tunnel syndrome, left upper limb: Secondary | ICD-10-CM

## 2014-10-27 NOTE — Patient Instructions (Signed)
Full time bracing  Continue B6

## 2014-10-29 ENCOUNTER — Encounter: Payer: Self-pay | Admitting: Orthopedic Surgery

## 2014-10-29 NOTE — Progress Notes (Signed)
Follow-up progress note established patient symptoms not improved  Chief Complaint  Patient presents with  . Follow-up    6 week follow up cts    History last saw this patient a properly 6 weeks ago we diagnosed with carpal tunnel syndrome and she was doing well with near full-time bracing and vitamin B 6. Her symptoms deteriorated when she started using kettle bells for exercise.  Review of systems allergies and symptomatic rhinitis. She also complains of bilateral knee pain. This is been worked up before and she had physical therapy and also saw a neurologist for bilateral leg pain. She took some time Minerva Areolaric and that helped her symptoms. She seems to get symptoms every 5-6 months.  She was seen by orthopedics and no demonstrable surgical lesion was noted at that time  She also complains of muscle spasms in her back and has been seen by a chiropractor and that seemed to help  Today she would also like to discuss her pes planus and she was given some orthotics back before and she has some but they probably need to be redone. We advised her that we could set her up with an orthopedist to have that done. Based on her intermittent symptoms of leg pain and knee pain with aching she asked for temporary handicap sticker and that was granted  Past Medical History  Diagnosis Date  . Allergy   . Hypertension   . Hyperlipidemia   . Obesity   . Environmental allergies   . Arthritis     She doesn't have diabetes or thyroid disease as risk factors for carpal tunnel but she does do a lot of keyboarding.  BP 125/78 mmHg  Ht 5\' 6"  (1.676 m)  Wt 209 lb (94.802 kg)  BMI 33.75 kg/m2 Today we find no swelling in either wrist or hand she has full range of motion in both grip strength is normal in both both wrist joints are stable. She has mild tenderness over the carpal tunnel with no FCR tenderness.  Sensitivity shows decreased sensation in the median nerve distribution but good vascularity in color  bilaterally  She has regressed a little bit with a carpal tunnel syndrome based on her activity level we have asked her to curb that continued full-time bracing and vitamin B 6 and I'll see her back in 2 months.

## 2014-11-17 ENCOUNTER — Emergency Department (HOSPITAL_BASED_OUTPATIENT_CLINIC_OR_DEPARTMENT_OTHER)
Admission: EM | Admit: 2014-11-17 | Discharge: 2014-11-17 | Disposition: A | Discharge status: Home / Self Care | Payer: Federal, State, Local not specified - PPO | Attending: Emergency Medicine | Admitting: Emergency Medicine

## 2014-11-17 ENCOUNTER — Encounter (HOSPITAL_BASED_OUTPATIENT_CLINIC_OR_DEPARTMENT_OTHER): Payer: Self-pay | Admitting: *Deleted

## 2014-11-17 DIAGNOSIS — Z7951 Long term (current) use of inhaled steroids: Secondary | ICD-10-CM | POA: Insufficient documentation

## 2014-11-17 DIAGNOSIS — Z8739 Personal history of other diseases of the musculoskeletal system and connective tissue: Secondary | ICD-10-CM | POA: Diagnosis not present

## 2014-11-17 DIAGNOSIS — J018 Other acute sinusitis: Secondary | ICD-10-CM | POA: Diagnosis not present

## 2014-11-17 DIAGNOSIS — E785 Hyperlipidemia, unspecified: Secondary | ICD-10-CM | POA: Diagnosis not present

## 2014-11-17 DIAGNOSIS — E669 Obesity, unspecified: Secondary | ICD-10-CM | POA: Insufficient documentation

## 2014-11-17 DIAGNOSIS — R51 Headache: Secondary | ICD-10-CM | POA: Diagnosis present

## 2014-11-17 DIAGNOSIS — I10 Essential (primary) hypertension: Secondary | ICD-10-CM | POA: Diagnosis not present

## 2014-11-17 DIAGNOSIS — Z792 Long term (current) use of antibiotics: Secondary | ICD-10-CM | POA: Insufficient documentation

## 2014-11-17 MED ORDER — FLUCONAZOLE 100 MG PO TABS: 100.0000 mg | ORAL_TABLET | Freq: Every day | ORAL | Status: AC

## 2014-11-17 MED ORDER — AMOXICILLIN-POT CLAVULANATE 875-125 MG PO TABS: 1.0000 | ORAL_TABLET | Freq: Two times a day (BID) | ORAL | Status: DC

## 2014-11-17 NOTE — ED Provider Notes (Signed)
CSN: [id number]     Arrival date & time 11/17/14  1228 History   First MD Initiated Contact with Patient 11/17/14 1248     Chief Complaint  Patient presents with  . Headache     (Consider location/radiation/quality/duration/timing/severity/associated sxs/prior Treatment) HPI Comments: Pt states that she was treated with amoxicillin 6 weeks ago and the symptoms got a little better and but never resolved. Symptoms have gotten worse in the last couple of days. No fever. Pain to the left side of head and face and left ear pain.  Patient is a 59 y.o. female presenting with headaches. The history is provided by the patient. No language interpreter was used.  Headache Pain location:  Frontal and occipital Quality:  Dull Radiates to:  Eyes Onset quality:  Gradual Timing:  Constant Progression:  Worsening Chronicity:  New Similar to prior headaches: yes   Context: not defecating and not eating   Relieved by:  Prescription medications Associated symptoms: congestion and facial pain   Associated symptoms: no back pain, no fever, no neck pain and no vomiting     Past Medical History  Diagnosis Date  . Allergy   . Hypertension   . Hyperlipidemia   . Obesity   . Environmental allergies   . Arthritis    Past Surgical History  Procedure Laterality Date  . Abdominal hysterectomy    . Cesarean section    . Tubal ligation     Family History  Problem Relation Age of Onset  . Hypertension Mother   . Arthritis Mother   . Hyperlipidemia Mother   . Diabetes Father   . Heart disease Father   . Obesity Sister   . Myocarditis Sister    History  Substance Use Topics  . Smoking status: Never Smoker   . Smokeless tobacco: Never Used  . Alcohol Use: No   OB History    No data available     Review of Systems  Constitutional: Negative for fever.  HENT: Positive for congestion.   Gastrointestinal: Negative for vomiting.  Musculoskeletal: Negative for back pain and neck pain.   Neurological: Positive for headaches.  All other systems reviewed and are negative.     Allergies  Mucinex and Naproxen  Home Medications   Prior to Admission medications   Medication Sig Start Date End Date Taking? Authorizing Provider  amLODipine-atorvastatin (CADUET) 5-20 MG per tablet Take 1 tablet by mouth daily.    Historical Provider, MD  amoxicillin-clavulanate (AUGMENTIN) 875-125 MG per tablet Take 1 tablet by mouth every 12 (twelve) hours. 11/17/14   Teressa LowerVrinda Aneesa Romey, NP  Budesonide-Formoterol Fumarate (SYMBICORT IN) Inhale into the lungs.    Historical Provider, MD  Canagliflozin (INVOKANA PO) Take by mouth.    Historical Provider, MD  Cholecalciferol (VITAMIN D PO) Take by mouth.    Historical Provider, MD  fluconazole (DIFLUCAN) 100 MG tablet Take 1 tablet (100 mg total) by mouth daily. 11/17/14 11/24/14  Teressa LowerVrinda Cru Kritikos, NP  mometasone (NASONEX) 50 MCG/ACT nasal spray Place 2 sprays into the nose daily.    Historical Provider, MD  PRESCRIPTION MEDICATION Inject 2 Units into the muscle once a week. Allergy shots (one in each arm)    Historical Provider, MD   BP 131/82 mmHg  Pulse 64  Temp(Src) 98.1 F (36.7 C) (Oral)  Resp 18  Ht 5\' 6"  (1.676 m)  Wt 209 lb (94.802 kg)  BMI 33.75 kg/m2  SpO2 99% Physical Exam  Constitutional: She appears well-developed and well-nourished.  HENT:  Head: Normocephalic.  Right Ear: External ear normal.  Left Ear: External ear normal.  Nose: Mucosal edema present. Left sinus exhibits maxillary sinus tenderness and frontal sinus tenderness.  Eyes: Conjunctivae and EOM are normal. Pupils are equal, round, and reactive to light.  Neck: Normal range of motion. Neck supple.  Cardiovascular: Normal rate and regular rhythm.   Pulmonary/Chest: Effort normal and breath sounds normal.  Neurological: She is alert.  Nursing note and vitals reviewed.   ED Course  Procedures (including critical care time) Labs Review Labs Reviewed - No data  to display  Imaging Review No results found.   EKG Interpretation None      MDM   Final diagnoses:  Other acute sinusitis    Will treat with augmentin since she recently had  Amoxicillin. Pt requesting diflucan for the symptoms    Teressa LowerVrinda Arieanna Pressey, NP 11/17/14 1317  Elwin MochaBlair Walden, MD 11/17/14 [phone]

## 2014-11-17 NOTE — Discharge Instructions (Signed)
Sinusitis °Sinusitis is redness, soreness, and puffiness (inflammation) of the air pockets in the bones of your face (sinuses). The redness, soreness, and puffiness can cause air and mucus to get trapped in your sinuses. This can allow germs to grow and cause an infection.  °HOME CARE  °· Drink enough fluids to keep your pee (urine) clear or pale yellow. °· Use a humidifier in your home. °· Run a hot shower to create steam in the bathroom. Sit in the bathroom with the door closed. Breathe in the steam 3-4 times a day. °· Put a warm, moist washcloth on your face 3-4 times a day, or as told by your doctor. °· Use salt water sprays (saline sprays) to wet the thick fluid in your nose. This can help the sinuses drain. °· Only take medicine as told by your doctor. °GET HELP RIGHT AWAY IF:  °· Your pain gets worse. °· You have very bad headaches. °· You are sick to your stomach (nauseous). °· You throw up (vomit). °· You are very sleepy (drowsy) all the time. °· Your face is puffy (swollen). °· Your vision changes. °· You have a stiff neck. °· You have trouble breathing. °MAKE SURE YOU:  °· Understand these instructions. °· Will watch your condition. °· Will get help right away if you are not doing well or get worse. °Document Released: 12/04/2007 Document Revised: 03/11/2012 Document Reviewed: 01/21/2012 °[employer]® Patient Information ©2015 [employer]. This information is not intended to replace advice given to you by your health care provider. Make sure you discuss any questions you have with your health care provider. ° °

## 2014-11-17 NOTE — ED Notes (Signed)
Headache and earache. Was given Amoxicillin for sinusitis 4-6 weeks ago.

## 2014-12-29 ENCOUNTER — Ambulatory Visit: Payer: Federal, State, Local not specified - PPO | Admitting: Orthopedic Surgery

## 2015-03-08 ENCOUNTER — Other Ambulatory Visit: Payer: Self-pay | Admitting: Gynecology

## 2015-03-09 ENCOUNTER — Ambulatory Visit: Payer: Federal, State, Local not specified - PPO | Admitting: Orthopedic Surgery

## 2015-03-09 LAB — CYTOLOGY - PAP

## 2015-04-10 ENCOUNTER — Ambulatory Visit: Payer: Federal, State, Local not specified - PPO | Admitting: Orthopedic Surgery

## 2015-05-04 ENCOUNTER — Encounter: Payer: Federal, State, Local not specified - PPO | Attending: Internal Medicine | Admitting: *Deleted

## 2015-05-04 ENCOUNTER — Encounter: Payer: Self-pay | Admitting: *Deleted

## 2015-05-04 VITALS — Ht 66.0 in | Wt 214.8 lb

## 2015-05-04 DIAGNOSIS — Z713 Dietary counseling and surveillance: Secondary | ICD-10-CM | POA: Diagnosis not present

## 2015-05-04 DIAGNOSIS — E119 Type 2 diabetes mellitus without complications: Secondary | ICD-10-CM

## 2015-05-04 NOTE — Patient Instructions (Signed)
Plan:  Aim for 3 Carb Choices per meal (45 grams) +/- 1 either way  Aim for 0-2 Carbs per snack if hungry  Include protein in moderation with your meals and snacks Consider reading food labels for Total Carbohydrate of foods Consider  increasing your activity level by looking for ways to dance or walk briskly as tolerated Consider checking BG at alternate times per day  Continue taking diabetes medication as directed by MD

## 2015-05-05 ENCOUNTER — Ambulatory Visit: Payer: Federal, State, Local not specified - PPO | Admitting: *Deleted

## 2015-05-12 NOTE — Progress Notes (Signed)
Diabetes Self-Management Education  Visit Type: First/Initial  Appt. Start Time: 1400 Appt. End Time: 1530  05/12/2015  [name], identified by name and date of birth, is a 59 y.o. female with a diagnosis of Diabetes: Type 2.   ASSESSMENT  Height 5\' 6"  (1.676 m), weight 214 lb 12.8 oz (97.433 kg). Body mass index is 34.69 kg/(m^2).      Diabetes Self-Management Education - 05/04/15 1412    Visit Information   Visit Type First/Initial   Initial Visit   Diabetes Type Type 2   Are you currently following a meal plan? No   Are you taking your medications as prescribed? Yes   Date Diagnosed 2014   Health Coping   How would you rate your overall health? Good   Psychosocial Assessment   Patient Belief/Attitude about Diabetes Motivated to manage diabetes  and afraid   Self-care barriers None   Other persons present Patient   Patient Concerns Nutrition/Meal planning;Glycemic Control;Weight Control   Preferred Learning Style Visual;Auditory   Learning Readiness Change in progress   What is the last grade level you completed in school? grad school - Social Work and Guidance   Complications   Last HgB A1C per patient/outside source 6.7 %   How often do you check your blood sugar? 0 times/day (not testing)   Have you had a dilated eye exam in the past 12 months? Yes   Have you had a dental exam in the past 12 months? Yes   Are you checking your feet? Yes   How many days per week are you checking your feet? 7   Dietary Intake   Breakfast egg, bacon on weekends OR at work, cereal and fresh fruit OR yogurt OR now has to be finger food   Snack (morning) fresh fruit OR almonds OR cheese crackers from vending machine OR popcorn   Lunch meat, 2 vegetables OR meal salad with Ranch dressing OR if food available at work like Naval architectpizza or hot dogs   Snack (afternoon) hungry for sweets- homemade cakes or cookies   Dinner meat, vegetables x 2, occasionally starch like potato   Snack  (evening) popcorn OR  pop tart OR chips and dip OR ice cream   Beverage(s) water, lemonade, diet soda, Gingerale   Exercise   Exercise Type Light (walking / raking leaves)  walks on trail at work if not too busy. Working a lot of overtime now   How many days per week to you exercise? 2   How many minutes per day do you exercise? 20   Total minutes per week of exercise 40   Patient Education   Previous Diabetes Education Yes (please comment)  a year ago at church   Disease state  Definition of diabetes, type 1 and 2, and the diagnosis of diabetes   Nutrition management  Role of diet in the treatment of diabetes and the relationship between the three main macronutrients and blood glucose level;Food label reading, portion sizes and measuring food.;Carbohydrate counting   Physical activity and exercise  Role of exercise on diabetes management, blood pressure control and cardiac health.   Medications Reviewed patients medication for diabetes, action, purpose, timing of dose and side effects.   Monitoring Purpose and frequency of SMBG.;Identified appropriate SMBG and/or A1C goals.   Chronic complications Relationship between chronic complications and blood glucose control   Psychosocial adjustment Role of stress on diabetes   Individualized Goals (developed by patient)   Nutrition Follow meal plan  discussed   Physical Activity Exercise 3-5 times per week   Medications take my medication as prescribed   Monitoring  test blood glucose pre and post meals as discussed   Outcomes   Expected Outcomes Demonstrated interest in learning. Expect positive outcomes   Future DMSE 4-6 wks   Program Status Not Completed      Individualized Plan for Diabetes Self-Management Training:   Learning Objective:  Patient will have a greater understanding of diabetes self-management. Patient education plan is to attend individual and/or group sessions per assessed needs and concerns.   Plan:   Patient  Instructions  Plan:  Aim for 3 Carb Choices per meal (45 grams) +/- 1 either way  Aim for 0-2 Carbs per snack if hungry  Include protein in moderation with your meals and snacks Consider reading food labels for Total Carbohydrate of foods Consider  increasing your activity level by looking for ways to dance or walk briskly as tolerated Consider checking BG at alternate times per day  Continue taking diabetes medication as directed by MD       Expected Outcomes:  Demonstrated interest in learning. Expect positive outcomes  Education material provided: Living Well with Diabetes, A1C conversion sheet, Meal plan card, Support group flyer and Carbohydrate counting sheet  If problems or questions, patient to contact team via:  Phone and Email  Future DSME appointment: 4-6 wks

## 2015-06-15 ENCOUNTER — Ambulatory Visit: Payer: Federal, State, Local not specified - PPO | Admitting: *Deleted

## 2015-06-30 ENCOUNTER — Encounter: Payer: Federal, State, Local not specified - PPO | Attending: Internal Medicine | Admitting: *Deleted

## 2015-06-30 ENCOUNTER — Encounter: Payer: Self-pay | Admitting: *Deleted

## 2015-06-30 DIAGNOSIS — E119 Type 2 diabetes mellitus without complications: Secondary | ICD-10-CM | POA: Insufficient documentation

## 2015-06-30 DIAGNOSIS — Z713 Dietary counseling and surveillance: Secondary | ICD-10-CM | POA: Insufficient documentation

## 2015-06-30 NOTE — Progress Notes (Signed)
Diabetes Self-Management Education  Visit Type:  Follow-up  Appt. Start Time: 0830 Appt. End Time: 0900  06/30/2015  Ms. Gloria JackRoslyn Schiller, identified by name and date of birth, is a 59 y.o. female with a diagnosis of Diabetes: Type 2.   ASSESSMENT  There were no vitals taken for this visit. There is no weight on file to calculate BMI.       Diabetes Self-Management Education - 06/30/15 1225    Psychosocial Assessment   Patient Belief/Attitude about Diabetes Other (comment)  overwhelmed with stresses in her life right now.   Self-care barriers None   Patient Concerns Weight Control;Healthy Lifestyle   Special Needs None   Learning Readiness Contemplating   Complications   How often do you check your blood sugar? 0 times/day (not testing)   Exercise   Exercise Type ADL's   Patient Education   Psychosocial adjustment Worked with patient to identify barriers to care and solutions;Helped patient identify a support system for diabetes management;Identified and addressed patients feelings and concerns about diabetes   Individualized Goals (developed by patient)   Nutrition Follow meal plan discussed   Monitoring  Other (comment)  too overwhelming right now, pateint prefers to work on stress management and food choices right now   Health Coping ask for help with (comment)  patient would like to consider counseling to help with high stress in her life.   Outcomes   Program Status Completed   Subsequent Visit   Since your last visit have you experienced any weight changes? --  patient chose to not weigh in today   Since your last visit, are you checking your blood glucose at least once a day? No      Learning Objective:  Patient will have a greater understanding of diabetes self-management. Patient education plan is to attend individual and/or group sessions per assessed needs and concerns.   Plan:   Patient Instructions  Plan:  Aim for 3 Carb Choices per meal (45 grams) +/- 1  either way  Aim for 0-2 Carbs per snack if hungry  Include protein in moderation with your meals and snacks Consider reading food labels for Total Carbohydrate of foods Consider  increasing your activity level by looking for ways to dance or walk briskly as tolerated Consider checking BG at alternate times per day  Continue taking diabetes medication as directed by MD Set reasonable goals for yourself with your life as well as your diabetes        Expected Outcomes:  Demonstrated interest in learning. Expect positive outcomes  Education material provided: Written handout on Life Balance to demonstrate all of her responsibilities and help her identify areas that are most important now, and what she would like to work towards.  If problems or questions, patient to contact team via:  Phone and Email  Future DSME appointment: - 4-6 wks

## 2015-06-30 NOTE — Patient Instructions (Signed)
Plan:  Aim for 3 Carb Choices per meal (45 grams) +/- 1 either way  Aim for 0-2 Carbs per snack if hungry  Include protein in moderation with your meals and snacks Consider reading food labels for Total Carbohydrate of foods Consider  increasing your activity level by looking for ways to dance or walk briskly as tolerated Consider checking BG at alternate times per day  Continue taking diabetes medication as directed by MD Set reasonable goals for yourself with your life as well as your diabetes

## 2015-08-11 ENCOUNTER — Ambulatory Visit: Payer: Federal, State, Local not specified - PPO | Admitting: *Deleted

## 2015-08-25 ENCOUNTER — Ambulatory Visit: Payer: Federal, State, Local not specified - PPO | Admitting: *Deleted

## 2015-09-21 ENCOUNTER — Other Ambulatory Visit: Payer: Self-pay

## 2015-09-21 DIAGNOSIS — Z1231 Encounter for screening mammogram for malignant neoplasm of breast: Secondary | ICD-10-CM

## 2015-10-02 ENCOUNTER — Ambulatory Visit: Payer: Federal, State, Local not specified - PPO

## 2015-10-03 ENCOUNTER — Ambulatory Visit (INDEPENDENT_AMBULATORY_CARE_PROVIDER_SITE_OTHER): Payer: Federal, State, Local not specified - PPO | Admitting: Sports Medicine

## 2015-10-03 ENCOUNTER — Ambulatory Visit (INDEPENDENT_AMBULATORY_CARE_PROVIDER_SITE_OTHER): Payer: Federal, State, Local not specified - PPO

## 2015-10-03 VITALS — BP 123/67 | HR 77 | Resp 16 | Ht 66.0 in | Wt 209.0 lb

## 2015-10-03 DIAGNOSIS — M79671 Pain in right foot: Secondary | ICD-10-CM

## 2015-10-03 DIAGNOSIS — M79672 Pain in left foot: Secondary | ICD-10-CM | POA: Diagnosis not present

## 2015-10-03 DIAGNOSIS — M779 Enthesopathy, unspecified: Secondary | ICD-10-CM

## 2015-10-03 DIAGNOSIS — B351 Tinea unguium: Secondary | ICD-10-CM

## 2015-10-03 DIAGNOSIS — J3081 Allergic rhinitis due to animal (cat) (dog) hair and dander: Secondary | ICD-10-CM | POA: Diagnosis not present

## 2015-10-03 DIAGNOSIS — E119 Type 2 diabetes mellitus without complications: Secondary | ICD-10-CM | POA: Diagnosis not present

## 2015-10-03 DIAGNOSIS — L570 Actinic keratosis: Secondary | ICD-10-CM

## 2015-10-03 DIAGNOSIS — J301 Allergic rhinitis due to pollen: Secondary | ICD-10-CM | POA: Diagnosis not present

## 2015-10-03 DIAGNOSIS — M722 Plantar fascial fibromatosis: Secondary | ICD-10-CM | POA: Diagnosis not present

## 2015-10-03 MED ORDER — MELOXICAM 15 MG PO TABS: 15.0000 mg | ORAL_TABLET | Freq: Every day | ORAL | Status: DC

## 2015-10-03 NOTE — Progress Notes (Signed)
Patient ID: Gloria Brooks, female   DOB: [birth year], 60 y.o.   MRN: [id number] Subjective: Gloria Brooks is a 60 y.o. female patient with history of diabetes who presents to office today complaining of long, painful callus at bottom of right for and nails while ambulating in shoes; unable to trim. Patient states that the glucose reading this morning was not recorded. Patient also admits to bilateral foot pain states that the pain is better but every now in then has pain when she first gets up at the back of the right heel with calf pain/cramps; reports that new shoes have helped. She went to ortho doctor about her knee and thinks that her knee pain is making the pain in her right heel at time worse. Patient denies any new changes in medication or new problems. Patient denies any new cramping, numbness, burning or tingling in the legs.  Patient Active Problem List   Diagnosis Date Noted  . Obesity    Current Outpatient Prescriptions on File Prior to Visit  Medication Sig Dispense Refill  . amLODipine-atorvastatin (CADUET) 5-20 MG per tablet Take 1 tablet by mouth daily.    . Budesonide-Formoterol Fumarate (SYMBICORT IN) Inhale into the lungs as needed.     . Canagliflozin (INVOKANA PO) Take by mouth.    . Cholecalciferol (VITAMIN D PO) Take by mouth.    . mometasone (NASONEX) 50 MCG/ACT nasal spray Place 2 sprays into the nose as needed.     Marland Kitchen. PRESCRIPTION MEDICATION Inject 2 Units into the muscle once a week. Allergy shots (one in each arm)     No current facility-administered medications on file prior to visit.   Allergies  Allergen Reactions  . Food     Shrimp  . Mucinex [Guaifenesin Er] Swelling    Pt reports throat swelling  . Naproxen Itching    Associated with other pain meds as well    No results found for this or any previous visit (from the past 2160 hour(s)).  Objective: General: Patient is awake, alert, and oriented x 3 and in no acute distress.  Integument: Skin is  warm, dry and supple bilateral. Nails are tender, long, thickened and  dystrophic with subungual debris, consistent with onychomycosis, 1-5 bilateral. No signs of infection. No open lesions bilateral. + callus right plantar midfoot with nucleated core resembling porokeratosis. Remaining integument unremarkable.  Vasculature:  Dorsalis Pedis pulse 2/4 bilateral. Posterior Tibial pulse  1/4 bilateral.  Capillary fill time <3 sec 1-5 bilateral. Positive hair growth to the level of the digits. Temperature gradient within normal limits. No varicosities present bilateral. No edema present bilateral.   Neurology: The patient has intact sensation measured with a 5.07/10g Semmes Weinstein Monofilament at all pedal sites bilateral . Vibratory sensation intact bilateral with tuning fork. No Babinski sign present bilateral.   Musculoskeletal: No reproducible pain at right or left foot. Pes planus foot type noted bilateral. Muscular strength 5/5 in all lower extremity muscular groups bilateral without pain on range of motion . No tenderness with calf compression bilateral.  X-rays bilateral- Normal osseous mineralization, calcaneal spurs present with mild arthritic changes L>R foot, mild hammertoe, no fracture, no dislocation, soft tissues within normal limits.   Assessment and Plan: Problem List Items Addressed This Visit    None    Visit Diagnoses    Foot pain, bilateral    -  Primary    Relevant Orders    DG Foot Complete Right    DG Foot Complete Left  Diabetes mellitus without complication (HCC)        Dermatophytosis of nail        Tendonitis        Plantar fasciitis of right foot        Keratosis          -Examined patient. -Discussed and educated patient on diabetic foot care, especially with  regards to the vascular, neurological and musculoskeletal systems.  -Stressed the importance of good glycemic control and the detriment of not  controlling glucose levels in relation to the  foot. -Mechanically debrided callus x 1 using sterile chisel blade and all nails 1-5 bilateral using sterile nail nipper and filed with dremel without incident  -Xrays reviewed -Rx Meloxicam to take as needed for pain and inflammation -Recommend and instructed patient on gentle stretching exercises daily -Recommend daily icing -Patient has no symptoms at achilles or plantar fascia on today's exam however subjective states these are the areas where she has the occassional pain; Will consider fascial brace and or night splint and possible injection at next encounter based on symptoms  -Answered all patient questions -Patient to return in 4 weeks to check tendonitis/fasciitis sites  -Patient advised to call the office if any problems or questions arise in the meantime.  Asencion Islam, DPM

## 2015-10-03 NOTE — Progress Notes (Deleted)
   Subjective:    Patient ID: Gloria Brooks, female    DOB: 1956-03-20, 60 y.o.   MRN: [id number]  HPI    Review of Systems  All other systems reviewed and are negative.      Objective:   Physical Exam        Assessment & Plan:

## 2015-10-04 DIAGNOSIS — J3089 Other allergic rhinitis: Secondary | ICD-10-CM | POA: Diagnosis not present

## 2015-10-11 DIAGNOSIS — J3081 Allergic rhinitis due to animal (cat) (dog) hair and dander: Secondary | ICD-10-CM | POA: Diagnosis not present

## 2015-10-11 DIAGNOSIS — J3089 Other allergic rhinitis: Secondary | ICD-10-CM | POA: Diagnosis not present

## 2015-10-11 DIAGNOSIS — J301 Allergic rhinitis due to pollen: Secondary | ICD-10-CM | POA: Diagnosis not present

## 2015-10-13 ENCOUNTER — Ambulatory Visit
Admission: RE | Admit: 2015-10-13 | Discharge: 2015-10-13 | Disposition: A | Discharge status: Home / Self Care | Payer: Federal, State, Local not specified - PPO | Source: Ambulatory Visit

## 2015-10-13 DIAGNOSIS — Z1231 Encounter for screening mammogram for malignant neoplasm of breast: Secondary | ICD-10-CM

## 2015-10-19 DIAGNOSIS — J3089 Other allergic rhinitis: Secondary | ICD-10-CM | POA: Diagnosis not present

## 2015-10-19 DIAGNOSIS — J3081 Allergic rhinitis due to animal (cat) (dog) hair and dander: Secondary | ICD-10-CM | POA: Diagnosis not present

## 2015-10-19 DIAGNOSIS — J301 Allergic rhinitis due to pollen: Secondary | ICD-10-CM | POA: Diagnosis not present

## 2015-10-27 DIAGNOSIS — J3081 Allergic rhinitis due to animal (cat) (dog) hair and dander: Secondary | ICD-10-CM | POA: Diagnosis not present

## 2015-10-27 DIAGNOSIS — J3089 Other allergic rhinitis: Secondary | ICD-10-CM | POA: Diagnosis not present

## 2015-10-27 DIAGNOSIS — J301 Allergic rhinitis due to pollen: Secondary | ICD-10-CM | POA: Diagnosis not present

## 2015-10-31 ENCOUNTER — Encounter: Payer: Self-pay | Admitting: Sports Medicine

## 2015-10-31 ENCOUNTER — Ambulatory Visit (INDEPENDENT_AMBULATORY_CARE_PROVIDER_SITE_OTHER): Payer: Federal, State, Local not specified - PPO | Admitting: Sports Medicine

## 2015-10-31 DIAGNOSIS — M722 Plantar fascial fibromatosis: Secondary | ICD-10-CM

## 2015-10-31 DIAGNOSIS — M79672 Pain in left foot: Secondary | ICD-10-CM

## 2015-10-31 DIAGNOSIS — M779 Enthesopathy, unspecified: Secondary | ICD-10-CM

## 2015-10-31 DIAGNOSIS — E119 Type 2 diabetes mellitus without complications: Secondary | ICD-10-CM

## 2015-10-31 DIAGNOSIS — M79671 Pain in right foot: Secondary | ICD-10-CM | POA: Diagnosis not present

## 2015-10-31 MED ORDER — TRIAMCINOLONE ACETONIDE 10 MG/ML IJ SUSP: 10.0000 mg | Freq: Once | INTRAMUSCULAR | Status: AC

## 2015-11-01 NOTE — Progress Notes (Signed)
Patient ID: Gloria Brooks, female   DOB: [birth year], 60 y.o.   MRN: [id number] Subjective: Gloria Brooks is a 60 y.o. female patient returns to office with complaint of heel pain on the right>left. Patient states that pain is a little better but still bothersome in right foot more than left. + post static dyskensia that's a little better with stretching and icing. Could not tolerate Mobic caused upset stomach even with food. Denies any other pedal concerns.  FBS 97  Patient Active Problem List   Diagnosis Date Noted  . Obesity     Current Outpatient Prescriptions on File Prior to Visit  Medication Sig Dispense Refill  . amLODipine-atorvastatin (CADUET) 5-20 MG per tablet Take 1 tablet by mouth daily.    . Budesonide-Formoterol Fumarate (SYMBICORT IN) Inhale into the lungs as needed.     . Canagliflozin (INVOKANA PO) Take by mouth.    . Cholecalciferol (VITAMIN D PO) Take by mouth.    . meloxicam (MOBIC) 15 MG tablet Take 1 tablet (15 mg total) by mouth daily. 30 tablet 0  . mometasone (NASONEX) 50 MCG/ACT nasal spray Place 2 sprays into the nose as needed.     Marland Kitchen PRESCRIPTION MEDICATION Inject 2 Units into the muscle once a week. Allergy shots (one in each arm)     No current facility-administered medications on file prior to visit.    Allergies  Allergen Reactions  . Shellfish Allergy Anaphylaxis  . Naproxen Itching and Other (See Comments)    Associated with other pain meds as well  . Food     Shrimp  . Mucinex [Guaifenesin Er] Swelling    Pt reports throat swelling    Objective: Physical Exam General: The patient is alert and oriented x3 in no acute distress.  Dermatology: Skin is warm, dry and supple bilateral lower extremities. Nails 1-10 are short and thick There is no erythema, edema, no eccymosis, no open lesions present. Minimal callus at right forefoot was recently trimmed. Integument is otherwise unremarkable.  Vascular: Dorsalis Pedis pulse 2/4 and Posterior  Tibial pulse are 1/4 bilateral. Capillary fill time is immediate to all digits.  Neurological: Grossly intact to light touch with an achilles reflex of +2/5 and a  negative Tinel's sign bilateral. Protective and vibratory intact bilateral.   Musculoskeletal: Tenderness to palpation at the medial calcaneal tubercale and through the insertion of the plantar fascia on the right foot and along PT tendon. No acute symptoms on left however subjectively this heel hurts as well in same manner as right sometimes. No pain with compression of calcaneus bilateral. No pain with tuning fork to calcaneus bilateral. No pain with calf compression bilateral. There is decreased Ankle joint range of motion bilateral. All other joints range of motion within normal limits bilateral. Pes planus foot type. Strength 5/5 in all groups bilateral.   Assessment and Plan: Problem List Items Addressed This Visit    None    Visit Diagnoses    Foot pain, bilateral    -  Primary    Plantar fasciitis of right foot        Relevant Medications    triamcinolone acetonide (KENALOG) 10 MG/ML injection 10 mg    Plantar fasciitis of left foot        Tendonitis        Relevant Medications    triamcinolone acetonide (KENALOG) 10 MG/ML injection 10 mg    Diabetes mellitus without complication (HCC)        Relevant Medications  triamcinolone acetonide (KENALOG) 10 MG/ML injection 10 mg      -Complete examination performed. Discussed with patient in detail the condition of plantar fasciitis, how this occurs and general treatment options. Explained both conservative and surgical treatments.  -After oral consent and aseptic prep, injected a mixture containing 1 ml of 2%  plain lidocaine, 1 ml 0.5% plain marcaine, 0.5 ml of kenalog 10 and 0.5 ml of dexamethasone phosphate into right heel. Post-injection care discussed with patient.  -Advised patient to monitor sugars -Recommended good supportive shoes and advised use of OTC insert.  Explained to patient that if these orthoses work well, we will continue with these. If these do not improve her condition and  pain, we will consider custom molded orthoses. -Explained in detail the use of the fascial brace which was dispensed at today's visit for both the right and left foot. -Explained and dispensed to patient daily stretching exercises. -Recommend patient to ice affected area 1-2x daily. -Patient to return to office in 3-4 weeks for follow up or sooner if problems or questions  Arise.  Gloria Brooks, DPM

## 2015-11-02 ENCOUNTER — Encounter: Payer: Federal, State, Local not specified - PPO | Attending: Internal Medicine | Admitting: *Deleted

## 2015-11-02 NOTE — Patient Instructions (Signed)
Plan:  Aim for 3 Carb Choices per meal (45 grams) +/- 1 either way  Aim for 0-2 Carbs per snack if hungry  Include protein in moderation with your meals and snacks Consider reading food labels for Total Carbohydrate of foods Consider  increasing your activity level by looking for ways to dance or walk briskly as tolerated Consider checking BG at alternate times per day (once a week) Continue taking diabetes medication as directed by MD Set reasonable goals for yourself with your life as well as your diabetes, Consider speaking with your children about how they can support you in a more positive way

## 2015-11-14 DIAGNOSIS — J301 Allergic rhinitis due to pollen: Secondary | ICD-10-CM | POA: Diagnosis not present

## 2015-11-14 DIAGNOSIS — J3089 Other allergic rhinitis: Secondary | ICD-10-CM | POA: Diagnosis not present

## 2015-11-14 DIAGNOSIS — J3081 Allergic rhinitis due to animal (cat) (dog) hair and dander: Secondary | ICD-10-CM | POA: Diagnosis not present

## 2015-11-23 DIAGNOSIS — J301 Allergic rhinitis due to pollen: Secondary | ICD-10-CM | POA: Diagnosis not present

## 2015-11-23 DIAGNOSIS — J3089 Other allergic rhinitis: Secondary | ICD-10-CM | POA: Diagnosis not present

## 2015-11-23 DIAGNOSIS — J3081 Allergic rhinitis due to animal (cat) (dog) hair and dander: Secondary | ICD-10-CM | POA: Diagnosis not present

## 2015-11-28 ENCOUNTER — Ambulatory Visit (INDEPENDENT_AMBULATORY_CARE_PROVIDER_SITE_OTHER): Payer: Federal, State, Local not specified - PPO | Admitting: Sports Medicine

## 2015-11-28 ENCOUNTER — Encounter: Payer: Self-pay | Admitting: Sports Medicine

## 2015-11-28 DIAGNOSIS — R252 Cramp and spasm: Secondary | ICD-10-CM | POA: Diagnosis not present

## 2015-11-28 DIAGNOSIS — M79672 Pain in left foot: Secondary | ICD-10-CM

## 2015-11-28 DIAGNOSIS — M79671 Pain in right foot: Secondary | ICD-10-CM

## 2015-11-28 DIAGNOSIS — E119 Type 2 diabetes mellitus without complications: Secondary | ICD-10-CM

## 2015-11-28 DIAGNOSIS — L570 Actinic keratosis: Secondary | ICD-10-CM | POA: Diagnosis not present

## 2015-11-28 DIAGNOSIS — M779 Enthesopathy, unspecified: Secondary | ICD-10-CM

## 2015-11-28 DIAGNOSIS — J301 Allergic rhinitis due to pollen: Secondary | ICD-10-CM | POA: Diagnosis not present

## 2015-11-28 DIAGNOSIS — M722 Plantar fascial fibromatosis: Secondary | ICD-10-CM

## 2015-11-28 DIAGNOSIS — J3089 Other allergic rhinitis: Secondary | ICD-10-CM | POA: Diagnosis not present

## 2015-11-28 DIAGNOSIS — J3081 Allergic rhinitis due to animal (cat) (dog) hair and dander: Secondary | ICD-10-CM | POA: Diagnosis not present

## 2015-11-28 NOTE — Progress Notes (Signed)
Patient ID: Gloria Brooks, female   DOB: [birth year], 60 y.o.   MRN: [id number]   Subjective: Gloria Brooks is a 60 y.o. diabetic female patient returns to office with complaint of heel pain on the right>left. Patient states that pain is a little better but still bothersome in right foot more than left. + post static dyskensia that's a little better with stretching and icing but hasn't been stretching everday. Patient has callus to right foot that's also tender. Denies any other pedal concerns.  FBS not recorded   Patient Active Problem List   Diagnosis Date Noted  . Obesity     Current Outpatient Prescriptions on File Prior to Visit  Medication Sig Dispense Refill  . amLODipine-atorvastatin (CADUET) 5-20 MG per tablet Take 1 tablet by mouth daily.    . Budesonide-Formoterol Fumarate (SYMBICORT IN) Inhale into the lungs as needed.     . Canagliflozin (INVOKANA PO) Take by mouth.    . Cholecalciferol (VITAMIN D PO) Take by mouth.    . meloxicam (MOBIC) 15 MG tablet Take 1 tablet (15 mg total) by mouth daily. 30 tablet 0  . mometasone (NASONEX) 50 MCG/ACT nasal spray Place 2 sprays into the nose as needed.     Marland Kitchen. PRESCRIPTION MEDICATION Inject 2 Units into the muscle once a week. Allergy shots (one in each arm)     Current Facility-Administered Medications on File Prior to Visit  Medication Dose Route Frequency Provider Last Rate Last Dose  . triamcinolone acetonide (KENALOG) 10 MG/ML injection 10 mg  10 mg Other Once Asencion Islamitorya Joquan Lotz, DPM        Allergies  Allergen Reactions  . Shellfish Allergy Anaphylaxis  . Naproxen Itching and Other (See Comments)    Associated with other pain meds as well  . Food     Shrimp  . Mucinex [Guaifenesin Er] Swelling    Pt reports throat swelling    Objective: Physical Exam General: The patient is alert and oriented x3 in no acute distress.  Dermatology: Skin is warm, dry and supple bilateral lower extremities. Nails 1-10 are short and thick  There is no erythema, edema, no eccymosis, no open lesions present. Minimal callus at right plantar lateral foot. Integument is otherwise unremarkable.  Vascular: Dorsalis Pedis pulse 2/4 and Posterior Tibial pulse are 1/4 bilateral. Capillary fill time is immediate to all digits.  Neurological: Grossly intact to light touch with an achilles reflex of +2/5 and a  negative Tinel's sign bilateral. Protective and vibratory intact bilateral.   Musculoskeletal: Minimal tenderness to palpation at the medial calcaneal tubercale and through the insertion of the plantar fascia on the right foot and along PT tendon. No acute symptoms on left however subjectively this heel hurts as well in same manner as right sometimes. No pain with compression of calcaneus bilateral. No pain with tuning fork to calcaneus bilateral. No pain with calf compression bilateral. There is decreased Ankle joint range of motion bilateral. All other joints range of motion within normal limits bilateral. Pes planus foot type. Strength 5/5 in all groups bilateral.   Assessment and Plan: Problem List Items Addressed This Visit    None    Visit Diagnoses    Foot pain, bilateral    -  Primary    Plantar fasciitis of right foot        Plantar fasciitis of left foot        Tendonitis        Diabetes mellitus without complication (HCC)  Keratosis        Cramp of both lower extremities          -Complete examination performed. Discussed with patient in detail the condition of plantar fasciitis, how this occurs and general treatment options. Explained both conservative and surgical treatments.  -Rx night splint  -Cont with good supportive shoes and inserts -Cont with fascial brace daily  -Cont with daily stretching exercises. -Recommend patient to ice affected area 1-2x daily. -Mechanically debrided callus plantar lateral right foot using sterile chisel blade with problems. -Recommend tonic water and electrolyte balance for  cramps -Patient to return to office in 4-6 weeks for follow up or sooner if problems or questions qrise.  Asencion Islam, DPM

## 2015-11-30 DIAGNOSIS — J3089 Other allergic rhinitis: Secondary | ICD-10-CM | POA: Diagnosis not present

## 2015-11-30 DIAGNOSIS — J3081 Allergic rhinitis due to animal (cat) (dog) hair and dander: Secondary | ICD-10-CM | POA: Diagnosis not present

## 2015-11-30 DIAGNOSIS — J301 Allergic rhinitis due to pollen: Secondary | ICD-10-CM | POA: Diagnosis not present

## 2015-12-13 DIAGNOSIS — J3089 Other allergic rhinitis: Secondary | ICD-10-CM | POA: Diagnosis not present

## 2015-12-13 DIAGNOSIS — J301 Allergic rhinitis due to pollen: Secondary | ICD-10-CM | POA: Diagnosis not present

## 2015-12-19 DIAGNOSIS — J3089 Other allergic rhinitis: Secondary | ICD-10-CM | POA: Diagnosis not present

## 2015-12-19 DIAGNOSIS — J301 Allergic rhinitis due to pollen: Secondary | ICD-10-CM | POA: Diagnosis not present

## 2015-12-19 DIAGNOSIS — J3081 Allergic rhinitis due to animal (cat) (dog) hair and dander: Secondary | ICD-10-CM | POA: Diagnosis not present

## 2015-12-20 DIAGNOSIS — K080 Exfoliation of teeth due to systemic causes: Secondary | ICD-10-CM | POA: Diagnosis not present

## 2015-12-25 ENCOUNTER — Encounter: Payer: Self-pay | Admitting: Sports Medicine

## 2015-12-26 ENCOUNTER — Ambulatory Visit: Payer: Federal, State, Local not specified - PPO | Admitting: Sports Medicine

## 2015-12-26 DIAGNOSIS — J3089 Other allergic rhinitis: Secondary | ICD-10-CM | POA: Diagnosis not present

## 2015-12-26 DIAGNOSIS — J3081 Allergic rhinitis due to animal (cat) (dog) hair and dander: Secondary | ICD-10-CM | POA: Diagnosis not present

## 2015-12-26 DIAGNOSIS — J301 Allergic rhinitis due to pollen: Secondary | ICD-10-CM | POA: Diagnosis not present

## 2016-01-04 DIAGNOSIS — J301 Allergic rhinitis due to pollen: Secondary | ICD-10-CM | POA: Diagnosis not present

## 2016-01-04 DIAGNOSIS — J3089 Other allergic rhinitis: Secondary | ICD-10-CM | POA: Diagnosis not present

## 2016-01-04 DIAGNOSIS — J3081 Allergic rhinitis due to animal (cat) (dog) hair and dander: Secondary | ICD-10-CM | POA: Diagnosis not present

## 2016-01-10 DIAGNOSIS — J3089 Other allergic rhinitis: Secondary | ICD-10-CM | POA: Diagnosis not present

## 2016-01-10 DIAGNOSIS — J301 Allergic rhinitis due to pollen: Secondary | ICD-10-CM | POA: Diagnosis not present

## 2016-01-10 DIAGNOSIS — J3081 Allergic rhinitis due to animal (cat) (dog) hair and dander: Secondary | ICD-10-CM | POA: Diagnosis not present

## 2016-01-11 DIAGNOSIS — N760 Acute vaginitis: Secondary | ICD-10-CM | POA: Diagnosis not present

## 2016-01-11 DIAGNOSIS — N390 Urinary tract infection, site not specified: Secondary | ICD-10-CM | POA: Diagnosis not present

## 2016-01-23 DIAGNOSIS — G5602 Carpal tunnel syndrome, left upper limb: Secondary | ICD-10-CM | POA: Diagnosis not present

## 2016-01-23 DIAGNOSIS — G5601 Carpal tunnel syndrome, right upper limb: Secondary | ICD-10-CM | POA: Diagnosis not present

## 2016-01-23 DIAGNOSIS — M545 Low back pain: Secondary | ICD-10-CM | POA: Diagnosis not present

## 2016-01-23 DIAGNOSIS — M25561 Pain in right knee: Secondary | ICD-10-CM | POA: Diagnosis not present

## 2016-01-25 DIAGNOSIS — J301 Allergic rhinitis due to pollen: Secondary | ICD-10-CM | POA: Diagnosis not present

## 2016-01-25 DIAGNOSIS — Z91013 Allergy to seafood: Secondary | ICD-10-CM | POA: Diagnosis not present

## 2016-01-25 DIAGNOSIS — J3081 Allergic rhinitis due to animal (cat) (dog) hair and dander: Secondary | ICD-10-CM | POA: Diagnosis not present

## 2016-01-25 DIAGNOSIS — J4540 Moderate persistent asthma, uncomplicated: Secondary | ICD-10-CM | POA: Diagnosis not present

## 2016-01-25 DIAGNOSIS — J3089 Other allergic rhinitis: Secondary | ICD-10-CM | POA: Diagnosis not present

## 2016-01-27 DIAGNOSIS — M545 Low back pain: Secondary | ICD-10-CM | POA: Diagnosis not present

## 2016-02-01 ENCOUNTER — Ambulatory Visit: Payer: Federal, State, Local not specified - PPO | Admitting: Orthopedic Surgery

## 2016-02-02 DIAGNOSIS — M810 Age-related osteoporosis without current pathological fracture: Secondary | ICD-10-CM | POA: Diagnosis not present

## 2016-02-02 DIAGNOSIS — E559 Vitamin D deficiency, unspecified: Secondary | ICD-10-CM | POA: Diagnosis not present

## 2016-02-02 DIAGNOSIS — I10 Essential (primary) hypertension: Secondary | ICD-10-CM | POA: Diagnosis not present

## 2016-02-02 DIAGNOSIS — E119 Type 2 diabetes mellitus without complications: Secondary | ICD-10-CM | POA: Diagnosis not present

## 2016-02-05 DIAGNOSIS — J3081 Allergic rhinitis due to animal (cat) (dog) hair and dander: Secondary | ICD-10-CM | POA: Diagnosis not present

## 2016-02-05 DIAGNOSIS — J4540 Moderate persistent asthma, uncomplicated: Secondary | ICD-10-CM | POA: Diagnosis not present

## 2016-02-05 DIAGNOSIS — J301 Allergic rhinitis due to pollen: Secondary | ICD-10-CM | POA: Diagnosis not present

## 2016-02-05 DIAGNOSIS — Z91013 Allergy to seafood: Secondary | ICD-10-CM | POA: Diagnosis not present

## 2016-02-07 DIAGNOSIS — E669 Obesity, unspecified: Secondary | ICD-10-CM | POA: Diagnosis not present

## 2016-02-07 DIAGNOSIS — Z0000 Encounter for general adult medical examination without abnormal findings: Secondary | ICD-10-CM | POA: Diagnosis not present

## 2016-02-07 DIAGNOSIS — E784 Other hyperlipidemia: Secondary | ICD-10-CM | POA: Diagnosis not present

## 2016-02-07 DIAGNOSIS — I129 Hypertensive chronic kidney disease with stage 1 through stage 4 chronic kidney disease, or unspecified chronic kidney disease: Secondary | ICD-10-CM | POA: Diagnosis not present

## 2016-02-07 DIAGNOSIS — M545 Low back pain: Secondary | ICD-10-CM | POA: Diagnosis not present

## 2016-02-07 DIAGNOSIS — Z1389 Encounter for screening for other disorder: Secondary | ICD-10-CM | POA: Diagnosis not present

## 2016-02-07 DIAGNOSIS — E119 Type 2 diabetes mellitus without complications: Secondary | ICD-10-CM | POA: Diagnosis not present

## 2016-02-12 DIAGNOSIS — Z1212 Encounter for screening for malignant neoplasm of rectum: Secondary | ICD-10-CM | POA: Diagnosis not present

## 2016-03-16 DIAGNOSIS — Z23 Encounter for immunization: Secondary | ICD-10-CM | POA: Diagnosis not present

## 2016-03-18 DIAGNOSIS — J301 Allergic rhinitis due to pollen: Secondary | ICD-10-CM | POA: Diagnosis not present

## 2016-03-18 DIAGNOSIS — J3081 Allergic rhinitis due to animal (cat) (dog) hair and dander: Secondary | ICD-10-CM | POA: Diagnosis not present

## 2016-03-18 DIAGNOSIS — J3089 Other allergic rhinitis: Secondary | ICD-10-CM | POA: Diagnosis not present

## 2016-03-19 ENCOUNTER — Ambulatory Visit: Payer: Federal, State, Local not specified - PPO | Admitting: Sports Medicine

## 2016-03-20 DIAGNOSIS — J3089 Other allergic rhinitis: Secondary | ICD-10-CM | POA: Diagnosis not present

## 2016-03-20 DIAGNOSIS — J301 Allergic rhinitis due to pollen: Secondary | ICD-10-CM | POA: Diagnosis not present

## 2016-03-20 DIAGNOSIS — J3081 Allergic rhinitis due to animal (cat) (dog) hair and dander: Secondary | ICD-10-CM | POA: Diagnosis not present

## 2016-03-22 ENCOUNTER — Encounter: Payer: Federal, State, Local not specified - PPO | Admitting: Neurology

## 2016-03-27 DIAGNOSIS — J301 Allergic rhinitis due to pollen: Secondary | ICD-10-CM | POA: Diagnosis not present

## 2016-03-27 DIAGNOSIS — J3089 Other allergic rhinitis: Secondary | ICD-10-CM | POA: Diagnosis not present

## 2016-03-27 DIAGNOSIS — J3081 Allergic rhinitis due to animal (cat) (dog) hair and dander: Secondary | ICD-10-CM | POA: Diagnosis not present

## 2016-04-02 DIAGNOSIS — J3081 Allergic rhinitis due to animal (cat) (dog) hair and dander: Secondary | ICD-10-CM | POA: Diagnosis not present

## 2016-04-02 DIAGNOSIS — J3089 Other allergic rhinitis: Secondary | ICD-10-CM | POA: Diagnosis not present

## 2016-04-02 DIAGNOSIS — J301 Allergic rhinitis due to pollen: Secondary | ICD-10-CM | POA: Diagnosis not present

## 2016-04-08 ENCOUNTER — Ambulatory Visit (INDEPENDENT_AMBULATORY_CARE_PROVIDER_SITE_OTHER): Payer: Federal, State, Local not specified - PPO | Admitting: Otolaryngology

## 2016-04-08 ENCOUNTER — Ambulatory Visit (INDEPENDENT_AMBULATORY_CARE_PROVIDER_SITE_OTHER): Payer: Federal, State, Local not specified - PPO | Admitting: Podiatry

## 2016-04-08 DIAGNOSIS — L851 Acquired keratosis [keratoderma] palmaris et plantaris: Secondary | ICD-10-CM

## 2016-04-08 DIAGNOSIS — Q828 Other specified congenital malformations of skin: Secondary | ICD-10-CM | POA: Diagnosis not present

## 2016-04-08 DIAGNOSIS — M79672 Pain in left foot: Secondary | ICD-10-CM

## 2016-04-08 DIAGNOSIS — J3081 Allergic rhinitis due to animal (cat) (dog) hair and dander: Secondary | ICD-10-CM | POA: Diagnosis not present

## 2016-04-08 DIAGNOSIS — E119 Type 2 diabetes mellitus without complications: Secondary | ICD-10-CM | POA: Diagnosis not present

## 2016-04-08 DIAGNOSIS — H9209 Otalgia, unspecified ear: Secondary | ICD-10-CM

## 2016-04-08 DIAGNOSIS — L84 Corns and callosities: Secondary | ICD-10-CM

## 2016-04-08 DIAGNOSIS — J3089 Other allergic rhinitis: Secondary | ICD-10-CM | POA: Diagnosis not present

## 2016-04-08 DIAGNOSIS — M79671 Pain in right foot: Secondary | ICD-10-CM

## 2016-04-08 DIAGNOSIS — J301 Allergic rhinitis due to pollen: Secondary | ICD-10-CM | POA: Diagnosis not present

## 2016-04-08 NOTE — Progress Notes (Signed)
Subjective: Patient presents to the office today for chief complaint of painful callus lesions of the feet. Patient states that the pain is ongoing and is affecting their ability to ambulate without pain. Patient presents today for further treatment and evaluation.  Objective:  Physical Exam General: Alert and oriented x3 in no acute distress  Dermatology: Hyperkeratotic lesion present on the lateral aspect of the right foot with a central core. Pain on palpation with a central nucleated core noted.  Skin is warm, dry and supple bilateral lower extremities. Negative for open lesions or macerations.  Vascular: Palpable pedal pulses bilaterally. No edema or erythema noted. Capillary refill within normal limits.  Neurological: Epicritic and protective threshold grossly intact bilaterally.   Musculoskeletal Exam: Pain on palpation at the keratotic lesion noted. Range of motion within normal limits bilateral. Muscle strength 5/5 in all groups bilateral.  Assessment: #1 porokeratosis right foot #2 pain in right foot   Plan of Care:  #1 Patient evaluated #2 Excisional debridement of  keratoic lesion using a chisel blade was performed without incident.  #3 Treated area(s) with Salinocaine and dressed with light dressing. #4 Patient is to return to the clinic PRN.   Felecia ShellingBrent M. Giovanni Bath, DPM [facility]

## 2016-04-08 NOTE — Patient Instructions (Signed)
Corns and Calluses Corns are small areas of thickened skin that occur on the top, sides, or tip of a toe. They contain a cone-shaped core with a point that can press on a nerve below. This causes pain. Calluses are areas of thickened skin that can occur anywhere on the body including hands, fingers, palms, soles of the feet, and heels.Calluses are usually larger than corns.  CAUSES  Corns and calluses are caused by rubbing (friction) or pressure, such as from shoes that are too tight or do not fit properly.  RISK FACTORS Corns are more likely to develop in people who have toe deformities, such as hammer toes. Since calluses can occur with friction to any area of the skin, calluses are more likely to develop in people who:   Work with their hands.  Wear shoes that fit poorly, shoes that are too tight, or shoes that are high-heeled.  Have toes deformities. SYMPTOMS Symptoms of a corn or callus include:  A hard growth on the skin.   Pain or tenderness under the skin.   Redness and swelling.   Increased discomfort while wearing tight-fitting shoes. DIAGNOSIS  Corns and calluses may be diagnosed with a medical history and physical exam.  TREATMENT  Corns and calluses may be treated with:  Removing the cause of the friction or pressure. This may include:  Changing your shoes.  Wearing shoe inserts (orthotics) or other protective layers in your shoes, such as a corn pad.  Wearing gloves.  Medicines to help soften skin in the hardened, thickened areas.  Reducing the size of the corn or callus by removing the dead layers of skin.  Antibiotic medicines to treat infection.  Surgery, if a toe deformity is the cause. HOME CARE INSTRUCTIONS   Take medicines only as directed by your health care provider.  If you were prescribed an antibiotic, finish all of it even if you start to feel better.  Wear shoes that fit well. Avoid wearing high-heeled shoes and shoes that are too tight  or too loose.  Wear any padding, protective layers, gloves, or orthotics as directed by your health care provider.  Soak your hands or feet and then use a file or pumice stone to soften your corn or callus. Do this as directed by your health care provider.  Check your corn or callus every day for signs of infection. Watch for:  Redness, swelling, or pain.  Fluid, blood, or pus. SEEK MEDICAL CARE IF:   Your symptoms do not improve with treatment.  You have increased redness, swelling, or pain at the site of your corn or callus.  You have fluid, blood, or pus coming from your corn or callus.  You have new symptoms.   This information is not intended to replace advice given to you by your health care provider. Make sure you discuss any questions you have with your health care provider.   Document Released: 03/23/2004 Document Revised: 11/01/2014 Document Reviewed: 06/13/2014 [employer] Interactive Patient Education 2016 [employer]  

## 2016-04-16 DIAGNOSIS — J3089 Other allergic rhinitis: Secondary | ICD-10-CM | POA: Diagnosis not present

## 2016-04-16 DIAGNOSIS — J301 Allergic rhinitis due to pollen: Secondary | ICD-10-CM | POA: Diagnosis not present

## 2016-04-16 DIAGNOSIS — J3081 Allergic rhinitis due to animal (cat) (dog) hair and dander: Secondary | ICD-10-CM | POA: Diagnosis not present

## 2016-04-24 DIAGNOSIS — J3081 Allergic rhinitis due to animal (cat) (dog) hair and dander: Secondary | ICD-10-CM | POA: Diagnosis not present

## 2016-04-24 DIAGNOSIS — J301 Allergic rhinitis due to pollen: Secondary | ICD-10-CM | POA: Diagnosis not present

## 2016-04-24 DIAGNOSIS — J3089 Other allergic rhinitis: Secondary | ICD-10-CM | POA: Diagnosis not present

## 2016-05-01 DIAGNOSIS — J301 Allergic rhinitis due to pollen: Secondary | ICD-10-CM | POA: Diagnosis not present

## 2016-05-01 DIAGNOSIS — J3081 Allergic rhinitis due to animal (cat) (dog) hair and dander: Secondary | ICD-10-CM | POA: Diagnosis not present

## 2016-05-01 DIAGNOSIS — J3089 Other allergic rhinitis: Secondary | ICD-10-CM | POA: Diagnosis not present

## 2016-05-08 DIAGNOSIS — J301 Allergic rhinitis due to pollen: Secondary | ICD-10-CM | POA: Diagnosis not present

## 2016-05-08 DIAGNOSIS — J3089 Other allergic rhinitis: Secondary | ICD-10-CM | POA: Diagnosis not present

## 2016-05-08 DIAGNOSIS — J3081 Allergic rhinitis due to animal (cat) (dog) hair and dander: Secondary | ICD-10-CM | POA: Diagnosis not present

## 2016-05-10 ENCOUNTER — Ambulatory Visit (INDEPENDENT_AMBULATORY_CARE_PROVIDER_SITE_OTHER): Payer: Federal, State, Local not specified - PPO | Admitting: Neurology

## 2016-05-10 ENCOUNTER — Encounter (INDEPENDENT_AMBULATORY_CARE_PROVIDER_SITE_OTHER): Payer: Self-pay | Admitting: Neurology

## 2016-05-10 DIAGNOSIS — Z0289 Encounter for other administrative examinations: Secondary | ICD-10-CM

## 2016-05-10 DIAGNOSIS — R202 Paresthesia of skin: Secondary | ICD-10-CM | POA: Diagnosis not present

## 2016-05-10 NOTE — Procedures (Signed)
Full Name: Gloria Brooks Gender: Female MRN #: [id number] Date of Birth: 02/15/1956    Visit Date: 05/10/2016 08:10 Age: [age] Years 2 Months Old Examining Physician: Levert FeinsteinYijun Avilyn Virtue, MD  Referring Physician: Pati GalloJames Kramer   History: 60 years old right-handed female presenting with a year history of intermittent bilateral hands paresthesia and right leg intermittent pain.  On examination: Bilateral upper and lower extremity motor sensory examination was normal. Deep tendon reflex was normal and symmetric. Gait was normal, she was able to ambulate with tiptoe and heel walking.  Findings: Right median, ulnar sensory and motor responses were normal. Right sural sensory response was normal. Right peroneal, tibial motor responses were normal.  Selected needle examination of right upper and lower extremity muscles, right cervical and lumbar sacral paraspinal muscles was normal.    Conclusion: This is a normal study. There is no electrodiagnostic evidence of right upper lower extremity neuropathy, there is no evidence of right cervical or right lumbosacral radiculopathy.    ------------------------------- Levert FeinsteinYijun Shamila Lerch, M.D.  [facility] [address] [place], [place] [address] Tel: [phone] Fax: [phone]        [facility] [place]    Nerve / Sites Rec. Site Peak Lat Ref. Amp.1-2 Ref. Distance    ms ms V V cm  R Sural - Ankle (Calf)     Calf Ankle 3.65 ?4.40 8.8 ?6.0 14  R Superficial peroneal - Ankle     Lat leg Ankle 3.65 ?4.40 11.4 ?6.0 14  R Median, Ulnar - Transcarpal comparison     Median Palm Wrist 2.08 ?2.20 70.1 ?50.0 8     Ulnar Palm Wrist 2.03 ?2.20 30.8 ?12.0 8          R Median - Orthodromic (Dig II, Mid palm)     Dig II Wrist 3.02 ?3.40 19.2 ?10.0 13  R Ulnar - Orthodromic, (Dig V, Mid palm)     Dig V Wrist 2.40 ?3.10 22.3 ?5.0 11     [facility]    Nerve / Sites Muscle Latency Ref. Amplitude Ref. Rel Amp Segments Distance Lat Diff Velocity Ref. Area    ms  ms mV mV %  cm ms m/s m/s mVms  R Median - APB     Wrist APB 3.2 ?4.4 7.4 ?4.0 100 Wrist - APB 7    22.2     Upper arm APB 7.2  7.7  104 Upper arm - Wrist 22 4.0 55  24.4  R Ulnar - ADM     Wrist ADM 2.3 ?3.3 14.3 ?6.0 100 Wrist - ADM 7    46.2     B.Elbow ADM 5.4  13.2  92.3 B.Elbow - Wrist 18 3.1 58 ?49 43.9     A.Elbow ADM 7.4  12.3  93 A.Elbow - B.Elbow 12 2.0 59 ?49 43.8         A.Elbow - Wrist  5.2     R Peroneal - EDB     Ankle EDB 4.3 ?6.5 10.7 ?2.0 100 Ankle - EDB 9    35.1     Fib head EDB 10.9  10.1  94.4 Fib head - Ankle 32 6.7 48 ?44 34.4     Pop fossa EDB 13.1  11.0  108 Pop fossa - Fib head 10 2.2 46 ?44 35.6         Acc Peron - Pop fossa       R Tibial - AH     Ankle AH 5.2 ?  5.8 5.3 ?4.0 100 Ankle - AH 9    14.6     Pop fossa AH 13.6  5.3  99.7 Pop fossa - Ankle 38 8.4 45 ?9241      F  Wave    Nerve F Lat Ref.   ms ms  R Median - APB 26.5 ?31.0  R Ulnar - ADM 26.8 ?32.0  R Peroneal - EDB 49.0 ?56.0  R Tibial - AH 41.0 ?56.0     EMG full       EMG Summary Table    Spontaneous MUAP Recruitment  Muscle IA Fib PSW Fasc Other Amp Dur. Poly Pattern  R. Extensor digitorum communis Normal None None None _______ Normal Normal Normal Normal  R. Pronator teres Normal None None None _______ Normal Normal Normal Normal  R. Biceps brachii Normal None None None _______ Normal Normal Normal Normal  R. Deltoid Normal None None None _______ Normal Normal Normal Normal  R. Triceps brachii Normal None None None _______ Normal Normal Normal Normal  R. Cervical paraspinals Normal None None None _______ Normal Normal Normal Normal  R. Vastus lateralis Normal None None None _______ Normal Normal Normal Normal  R. Lumbar paraspinals Normal None None None _______ Normal Normal Normal Normal  R. Tibialis anterior Normal None None None _______ Normal Normal Normal Normal  R. Gastrocnemius (Medial head) Normal None None None _______ Normal Normal Normal Normal

## 2016-05-15 DIAGNOSIS — J3089 Other allergic rhinitis: Secondary | ICD-10-CM | POA: Diagnosis not present

## 2016-05-15 DIAGNOSIS — J301 Allergic rhinitis due to pollen: Secondary | ICD-10-CM | POA: Diagnosis not present

## 2016-05-15 DIAGNOSIS — J3081 Allergic rhinitis due to animal (cat) (dog) hair and dander: Secondary | ICD-10-CM | POA: Diagnosis not present

## 2016-05-20 ENCOUNTER — Encounter: Payer: Federal, State, Local not specified - PPO | Attending: Internal Medicine | Admitting: Dietician

## 2016-05-20 ENCOUNTER — Encounter: Payer: Self-pay | Admitting: Dietician

## 2016-05-20 DIAGNOSIS — Z713 Dietary counseling and surveillance: Secondary | ICD-10-CM | POA: Insufficient documentation

## 2016-05-20 DIAGNOSIS — E119 Type 2 diabetes mellitus without complications: Secondary | ICD-10-CM | POA: Insufficient documentation

## 2016-05-20 DIAGNOSIS — I10 Essential (primary) hypertension: Secondary | ICD-10-CM | POA: Insufficient documentation

## 2016-05-20 DIAGNOSIS — K219 Gastro-esophageal reflux disease without esophagitis: Secondary | ICD-10-CM | POA: Insufficient documentation

## 2016-05-20 NOTE — Progress Notes (Signed)
Diabetes Self-Management Education  Visit Type:  Follow-up  Appt. Start Time: 1530 Appt. End Time: 1615  05/20/2016  [name], identified by name and date of birth, is a 60 y.o. female with a diagnosis of Diabetes:   Type 2 as well as HTN.  She is currently on Invokana but dislikes due to frequent yeast infections.  She has taken Metformin in the past but did not tolerate due to IBS and resulting GI side effects.  She would like to get her blood sugar down and lose weight to avoid insulin.  Patient's mother lives with her.  She does the shopping and her mother cooks.  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.  She has seen Gloria Brooks, RD int he past.  She reports eating in front of the TV and eats due to stress especially at work. .   ASSESSMENT  Height 5\' 6"  (1.676 m), weight 213 lb (96.6 kg). Body mass index is 34.38 kg/m.       Diabetes Self-Management Education - 05/20/16 1535      Complications   How often do you check your blood sugar? 0 times/day (not testing)   Have you had a dilated eye exam in the past 12 months? Yes   Have you had a dental exam in the past 12 months? Yes   Are you checking your feet? Yes   How many days per week are you checking your feet? 7     Dietary Intake   Breakfast cereal (raisin bran/honey bunches of oats)   Snack (morning) half a poptart   Lunch leftover zaxby's salad and vanilla pudding (used to order out)   Snack (afternoon) fast break, peanuts, granola bar, or cookies   Dinner baked chicken or chicken casserole, potatoes, vegetables, pintos   Snack (evening) ice cream (bored), skinny popcorn   Beverage(s) water, diet coke, gingerale, lemonade, half and half swt/unsweet tea     Exercise   Exercise Type Light (walking / raking leaves)  [employer]   How many days per week to you exercise? 2   How many minutes per day do you exercise? 75   Total minutes per week of exercise 150     Patient Education   Previous Diabetes Education Yes  (please comment)     Subsequent Visit   Since your last visit have you experienced any weight changes? Loss      Learning Objective:  Patient will have a greater understanding of diabetes self-management. Patient education plan is to attend individual and/or group sessions per assessed needs and concerns.   Plan:   Patient Instructions  Restart the vitamin D. Discuss the Invokana with your MD. If you go on the Metformin again, consider the extended release. Stay as active as possible.  Aim for 30 minutes most day.  Continue the line dancing class.  Consider doing the zumba classes as well. Keep sweets out of site. Avoid eating in front of the TV. If you are craving, try to distract yourself. Eat slowly, stop when you are satisfied.   Expected Outcomes:   Patient demonstrated interest in learning.  Good compliance expected.  Education material provided: Meal plan card, My Plate and Snack sheet, support group list  If problems or questions, patient to contact team via:  Phone and Email  Future DSME appointment: -  PRN

## 2016-05-20 NOTE — Patient Instructions (Addendum)
Restart the vitamin D. Discuss the Invokana with your MD. If you go on the Metformin again, consider the extended release. Stay as active as possible.  Aim for 30 minutes most day.  Continue the line dancing class.  Consider doing the zumba classes as well. Keep sweets out of site. Avoid eating in front of the TV. If you are craving, try to distract yourself. Eat slowly, stop when you are satisfied.

## 2016-05-22 DIAGNOSIS — J3081 Allergic rhinitis due to animal (cat) (dog) hair and dander: Secondary | ICD-10-CM | POA: Diagnosis not present

## 2016-05-22 DIAGNOSIS — J301 Allergic rhinitis due to pollen: Secondary | ICD-10-CM | POA: Diagnosis not present

## 2016-05-22 DIAGNOSIS — J3089 Other allergic rhinitis: Secondary | ICD-10-CM | POA: Diagnosis not present

## 2016-06-05 ENCOUNTER — Encounter: Payer: Self-pay | Admitting: Internal Medicine

## 2016-06-11 DIAGNOSIS — E119 Type 2 diabetes mellitus without complications: Secondary | ICD-10-CM | POA: Diagnosis not present

## 2016-06-11 DIAGNOSIS — E669 Obesity, unspecified: Secondary | ICD-10-CM | POA: Diagnosis not present

## 2016-06-11 DIAGNOSIS — I129 Hypertensive chronic kidney disease with stage 1 through stage 4 chronic kidney disease, or unspecified chronic kidney disease: Secondary | ICD-10-CM | POA: Diagnosis not present

## 2016-06-11 DIAGNOSIS — F329 Major depressive disorder, single episode, unspecified: Secondary | ICD-10-CM | POA: Diagnosis not present

## 2016-06-12 DIAGNOSIS — J3081 Allergic rhinitis due to animal (cat) (dog) hair and dander: Secondary | ICD-10-CM | POA: Diagnosis not present

## 2016-06-12 DIAGNOSIS — J3089 Other allergic rhinitis: Secondary | ICD-10-CM | POA: Diagnosis not present

## 2016-06-12 DIAGNOSIS — J301 Allergic rhinitis due to pollen: Secondary | ICD-10-CM | POA: Diagnosis not present

## 2016-06-16 DIAGNOSIS — N760 Acute vaginitis: Secondary | ICD-10-CM | POA: Diagnosis not present

## 2016-07-15 ENCOUNTER — Ambulatory Visit: Payer: Federal, State, Local not specified - PPO | Admitting: Podiatry

## 2016-08-06 DIAGNOSIS — J3089 Other allergic rhinitis: Secondary | ICD-10-CM | POA: Diagnosis not present

## 2016-08-06 DIAGNOSIS — K080 Exfoliation of teeth due to systemic causes: Secondary | ICD-10-CM | POA: Diagnosis not present

## 2016-08-06 DIAGNOSIS — J3081 Allergic rhinitis due to animal (cat) (dog) hair and dander: Secondary | ICD-10-CM | POA: Diagnosis not present

## 2016-08-06 DIAGNOSIS — J301 Allergic rhinitis due to pollen: Secondary | ICD-10-CM | POA: Diagnosis not present

## 2016-08-14 ENCOUNTER — Ambulatory Visit (HOSPITAL_COMMUNITY)
Admission: EM | Admit: 2016-08-14 | Discharge: 2016-08-14 | Disposition: A | Discharge status: Home / Self Care | Payer: Federal, State, Local not specified - PPO | Attending: Family Medicine | Admitting: Family Medicine

## 2016-08-14 ENCOUNTER — Encounter (HOSPITAL_COMMUNITY): Payer: Self-pay | Admitting: Emergency Medicine

## 2016-08-14 DIAGNOSIS — J019 Acute sinusitis, unspecified: Secondary | ICD-10-CM | POA: Diagnosis not present

## 2016-08-14 MED ORDER — HYDROCODONE-HOMATROPINE 5-1.5 MG/5ML PO SYRP: 5.0000 mL | ORAL_SOLUTION | Freq: Four times a day (QID) | ORAL | 1 refills | Status: DC | PRN

## 2016-08-14 MED ORDER — IPRATROPIUM BROMIDE 0.06 % NA SOLN: 2.0000 | Freq: Four times a day (QID) | NASAL | 1 refills | Status: DC

## 2016-08-14 MED ORDER — AZITHROMYCIN 250 MG PO TABS: ORAL_TABLET | ORAL | 0 refills | Status: DC

## 2016-08-14 NOTE — ED Triage Notes (Signed)
[facility] a [facility] cough, sinus pain and pressure, a sore throat and general body aches. She reported that the symptoms have been recurring for 8 weeks. The patient reported using OTC meds.

## 2016-08-14 NOTE — Discharge Instructions (Signed)
Drink plenty of fluids as discussed, use medicine as prescribed, and  delsym for cough. Return or see your doctor if further problems °

## 2016-08-14 NOTE — ED Provider Notes (Signed)
[facility]    CSN: [id number] Arrival date & time: 08/14/16  1803     History   Chief Complaint Chief Complaint  Patient presents with  . Cough  . Otalgia    HPI Gloria Brooks is a 61 y.o. female.   The history is provided by the patient.  Cough  Cough characteristics:  Non-productive Severity:  Moderate Onset quality:  Gradual Duration:  8 weeks Progression:  Unchanged Chronicity:  New Smoker: no   Context: upper respiratory infection and weather changes   Ineffective treatments:  Steroid inhaler Associated symptoms: ear pain, rhinorrhea and sinus congestion   Associated symptoms: no fever and no shortness of breath   Otalgia  Associated symptoms: cough and rhinorrhea   Associated symptoms: no congestion and no fever     Past Medical History:  Diagnosis Date  . Allergy   . Arthritis   . Diabetes mellitus without complication (HCC)   . Environmental allergies   . Hyperlipidemia   . Hypertension   . Obesity     Patient Active Problem List   Diagnosis Date Noted  . Paresthesia 05/10/2016  . Obesity     Past Surgical History:  Procedure Laterality Date  . ABDOMINAL HYSTERECTOMY    . CESAREAN SECTION    . TUBAL LIGATION      OB History    No data available       Home Medications    Prior to Admission medications   Medication Sig Start Date End Date Taking? Authorizing Provider  amLODipine-atorvastatin (CADUET) 5-20 MG per tablet Take 1 tablet by mouth daily.   Yes Historical Provider, MD  Budesonide-Formoterol Fumarate (SYMBICORT IN) Inhale into the lungs as needed.    Yes Historical Provider, MD  mometasone (NASONEX) 50 MCG/ACT nasal spray Place 2 sprays into the nose as needed.    Yes Historical Provider, MD  sitaGLIPtin-metformin (JANUMET) 50-500 MG tablet Take 1 tablet by mouth 2 (two) times daily with a meal.   Yes Historical Provider, MD  azithromycin (ZITHROMAX Z-PAK) 250 MG tablet Take as directed on pack 08/14/16   Linna Hoff, MD  HYDROcodone-homatropine [facility]) 5-1.5 MG/5ML syrup Take 5 mLs by mouth every 6 (six) hours as needed for cough. 08/14/16   Linna Hoff, MD  ipratropium (ATROVENT) 0.06 % nasal spray Place 2 sprays into both nostrils 4 (four) times daily. 08/14/16   Linna Hoff, MD    Family History Family History  Problem Relation Age of Onset  . Hypertension Mother   . Arthritis Mother   . Hyperlipidemia Mother   . Diabetes Father   . Heart disease Father   . Obesity Sister   . Myocarditis Sister     Social History Social History  Substance Use Topics  . Smoking status: Never Smoker  . Smokeless tobacco: Never Used  . Alcohol use No     Allergies   Shellfish allergy; Naproxen; Food; and Mucinex [guaifenesin er]   Review of Systems Review of Systems  Constitutional: Negative.  Negative for activity change and fever.  HENT: Positive for ear pain, postnasal drip and rhinorrhea. Negative for congestion.   Respiratory: Positive for cough. Negative for shortness of breath.   Cardiovascular: Negative.   Gastrointestinal: Negative.   Genitourinary: Negative.   All other systems reviewed and are negative.    Physical Exam Triage Vital Signs ED Triage Vitals  Enc Vitals Group     BP 08/14/16 1833 147/78     Pulse  Rate 08/14/16 1833 (!) 50     Resp 08/14/16 1833 18     Temp 08/14/16 1833 98.3 F (36.8 C)     Temp Source 08/14/16 1833 Oral     SpO2 08/14/16 1833 95 %     Weight --      Height --      Head Circumference --      Peak Flow --      Pain Score 08/14/16 1832 5     Pain Loc --      Pain Edu? --      Excl. in GC? --    No data found.   Updated Vital Signs BP 147/78 (BP Location: Right Arm)   Pulse (!) 50   Temp 98.3 F (36.8 C) (Oral)   Resp 18   SpO2 95%   Visual Acuity Right Eye Distance:   Left Eye Distance:   Bilateral Distance:    Right Eye Near:   Left Eye Near:    Bilateral Near:     Physical Exam  Constitutional: She is oriented  to person, place, and time. She appears well-developed and well-nourished. No distress.  HENT:  Right Ear: External ear normal.  Left Ear: External ear normal.  Nose: Nose normal.  Mouth/Throat: Oropharynx is clear and moist.  Neck: Normal range of motion. Neck supple.  Cardiovascular: Normal rate, regular rhythm, normal heart sounds and intact distal pulses.   Pulmonary/Chest: Effort normal and breath sounds normal.  Lymphadenopathy:    She has no cervical adenopathy.  Neurological: She is alert and oriented to person, place, and time.  Skin: Skin is warm and dry.  Nursing note and vitals reviewed.    UC Treatments / Results  Labs (all labs ordered are listed, but only abnormal results are displayed) Labs Reviewed - No data to display  EKG  EKG Interpretation None       Radiology No results found.  Procedures Procedures (including critical care time)  Medications Ordered in UC Medications - No data to display   Initial Impression / Assessment and Plan / UC Course  I have reviewed the triage vital signs and the nursing notes.  Pertinent labs & imaging results that were available during my care of the patient were reviewed by me and considered in my medical decision making (see chart for details).       Final Clinical Impressions(s) / UC Diagnoses   Final diagnoses:  Acute non-recurrent sinusitis, unspecified location    New Prescriptions New Prescriptions   AZITHROMYCIN (ZITHROMAX Z-PAK) 250 MG TABLET    Take as directed on pack   HYDROCODONE-HOMATROPINE (HYCODAN) 5-1.5 MG/5ML SYRUP    Take 5 mLs by mouth every 6 (six) hours as needed for cough.   IPRATROPIUM (ATROVENT) 0.06 % NASAL SPRAY    Place 2 sprays into both nostrils 4 (four) times daily.     Linna HoffJames D Eldo Umanzor, MD 08/14/16 [phone]

## 2016-08-20 DIAGNOSIS — L570 Actinic keratosis: Secondary | ICD-10-CM | POA: Diagnosis not present

## 2016-08-22 DIAGNOSIS — M810 Age-related osteoporosis without current pathological fracture: Secondary | ICD-10-CM | POA: Diagnosis not present

## 2016-09-06 DIAGNOSIS — J301 Allergic rhinitis due to pollen: Secondary | ICD-10-CM | POA: Diagnosis not present

## 2016-09-06 DIAGNOSIS — J3089 Other allergic rhinitis: Secondary | ICD-10-CM | POA: Diagnosis not present

## 2016-09-06 DIAGNOSIS — J3081 Allergic rhinitis due to animal (cat) (dog) hair and dander: Secondary | ICD-10-CM | POA: Diagnosis not present

## 2016-09-10 DIAGNOSIS — J3089 Other allergic rhinitis: Secondary | ICD-10-CM | POA: Diagnosis not present

## 2016-09-10 DIAGNOSIS — J3081 Allergic rhinitis due to animal (cat) (dog) hair and dander: Secondary | ICD-10-CM | POA: Diagnosis not present

## 2016-09-10 DIAGNOSIS — J301 Allergic rhinitis due to pollen: Secondary | ICD-10-CM | POA: Diagnosis not present

## 2016-09-14 ENCOUNTER — Encounter (HOSPITAL_COMMUNITY): Payer: Self-pay | Admitting: Emergency Medicine

## 2016-09-14 ENCOUNTER — Ambulatory Visit (HOSPITAL_COMMUNITY)
Admission: EM | Admit: 2016-09-14 | Discharge: 2016-09-14 | Disposition: A | Discharge status: Home / Self Care | Payer: Federal, State, Local not specified - PPO | Attending: Family Medicine | Admitting: Family Medicine

## 2016-09-14 DIAGNOSIS — J40 Bronchitis, not specified as acute or chronic: Secondary | ICD-10-CM

## 2016-09-14 MED ORDER — HYDROCODONE-HOMATROPINE 5-1.5 MG/5ML PO SYRP: 5.0000 mL | ORAL_SOLUTION | Freq: Four times a day (QID) | ORAL | 0 refills | Status: DC | PRN

## 2016-09-14 MED ORDER — FLUCONAZOLE 150 MG PO TABS: 150.0000 mg | ORAL_TABLET | Freq: Once | ORAL | 0 refills | Status: DC

## 2016-09-14 MED ORDER — LEVOFLOXACIN 500 MG PO TABS: 500.0000 mg | ORAL_TABLET | Freq: Every day | ORAL | 0 refills | Status: DC

## 2016-09-14 MED ORDER — FLUCONAZOLE 150 MG PO TABS: 150.0000 mg | ORAL_TABLET | Freq: Once | ORAL | 0 refills | Status: AC

## 2016-09-14 MED ORDER — PREDNISONE 20 MG PO TABS: ORAL_TABLET | ORAL | 0 refills | Status: DC

## 2016-09-14 NOTE — ED Provider Notes (Addendum)
[facility]    CSN: [id number] Arrival date & time: 09/14/16  1758     History   Chief Complaint Chief Complaint  Patient presents with  . URI    HPI Gloria Brooks is a 61 y.o. female.   Patient does not think she every really got over symptoms in February.  Patient reports symptoms have intensified and cough is interrupting sleep.    Patient reports that she's been sick since December. She has a problem with allergies in the past when she's had this, Dr. Corinda Gubler has given her cortisone along with an antibiotic.  Patient recently took amoxicillin but this did not help her at all.  A works in Presenter, broadcasting for [employer]      Past Medical History:  Diagnosis Date  . Allergy   . Arthritis   . Diabetes mellitus without complication (HCC)   . Environmental allergies   . Hyperlipidemia   . Hypertension   . Obesity     Patient Active Problem List   Diagnosis Date Noted  . Paresthesia 05/10/2016  . Obesity     Past Surgical History:  Procedure Laterality Date  . ABDOMINAL HYSTERECTOMY    . CESAREAN SECTION    . TUBAL LIGATION      OB History    No data available       Home Medications    Prior to Admission medications   Medication Sig Start Date End Date Taking? Authorizing Provider  Budesonide-Formoterol Fumarate (SYMBICORT IN) Inhale into the lungs as needed.    Yes Historical Provider, MD  amLODipine-atorvastatin (CADUET) 5-20 MG per tablet Take 1 tablet by mouth daily.    Historical Provider, MD  fluconazole (DIFLUCAN) 150 MG tablet Take 1 tablet (150 mg total) by mouth once. Repeat if needed 09/14/16 09/14/16  Elvina Sidle, MD  HYDROcodone-homatropine (HYDROMET) 5-1.5 MG/5ML syrup Take 5 mLs by mouth every 6 (six) hours as needed for cough. 09/14/16   Elvina Sidle, MD  levofloxacin (LEVAQUIN) 500 MG tablet Take 1 tablet (500 mg total) by mouth daily. 09/14/16   Elvina Sidle, MD  mometasone (NASONEX) 50 MCG/ACT nasal spray  Place 2 sprays into the nose as needed.     Historical Provider, MD  predniSONE (DELTASONE) 20 MG tablet Two daily with food 09/14/16   Elvina Sidle, MD  sitaGLIPtin-metformin (JANUMET) 50-500 MG tablet Take 1 tablet by mouth 2 (two) times daily with a meal.    Historical Provider, MD    Family History Family History  Problem Relation Age of Onset  . Hypertension Mother   . Arthritis Mother   . Hyperlipidemia Mother   . Diabetes Father   . Heart disease Father   . Obesity Sister   . Myocarditis Sister     Social History Social History  Substance Use Topics  . Smoking status: Never Smoker  . Smokeless tobacco: Never Used  . Alcohol use No     Allergies   Shellfish allergy; Naproxen; Food; and Mucinex [guaifenesin er]   Review of Systems Review of Systems  Constitutional: Negative.   HENT: Positive for congestion and sore throat.   Respiratory: Positive for cough and wheezing.   Cardiovascular: Negative.   Gastrointestinal: Negative.   Neurological: Negative.      Physical Exam Triage Vital Signs ED Triage Vitals  Enc Vitals Group     BP 09/14/16 1824 (!) 147/87     Pulse Rate 09/14/16 1824 86     Resp 09/14/16  1824 (!) 24     Temp 09/14/16 1824 98.6 F (37 C)     Temp Source 09/14/16 1824 Oral     SpO2 09/14/16 1824 99 %     Weight --      Height --      Head Circumference --      Peak Flow --      Pain Score 09/14/16 1823 7     Pain Loc --      Pain Edu? --      Excl. in GC? --    No data found.   Updated Vital Signs BP (!) 147/87 (BP Location: Right Arm)   Pulse 86   Temp 98.6 F (37 C) (Oral)   Resp (!) 24   SpO2 99%    Physical Exam  Constitutional: She is oriented to person, place, and time. She appears well-developed and well-nourished.  HENT:  Right Ear: External ear normal.  Left Ear: External ear normal.  Nose: Nose normal.  Mouth/Throat: Oropharynx is clear and moist.  Eyes: Conjunctivae and EOM are normal. Pupils are equal,  round, and reactive to light.  Neck: Normal range of motion. Neck supple.  Cardiovascular: Normal rate, regular rhythm and normal heart sounds.   Pulmonary/Chest: Effort normal.  Musculoskeletal: Normal range of motion.  Neurological: She is alert and oriented to person, place, and time.  Skin: Skin is warm and dry.  Nursing note and vitals reviewed.    UC Treatments / Results  Labs (all labs ordered are listed, but only abnormal results are displayed) Labs Reviewed - No data to display  EKG  EKG Interpretation None       Radiology No results found.  Procedures Procedures (including critical care time)  Medications Ordered in UC Medications - No data to display   Initial Impression / Assessment and Plan / UC Course  I have reviewed the triage vital signs and the nursing notes.  Pertinent labs & imaging results that were available during my care of the patient were reviewed by me and considered in my medical decision making (see chart for details).    Final Clinical Impressions(s) / UC Diagnoses   Final diagnoses:  Bronchitis    New Prescriptions Discharge Medication List as of 09/14/2016  7:22 PM    START taking these medications   Details  fluconazole (DIFLUCAN) 150 MG tablet Take 1 tablet (150 mg total) by mouth once. Repeat if needed, Starting Sat 09/14/2016, Normal    levofloxacin (LEVAQUIN) 500 MG tablet Take 1 tablet (500 mg total) by mouth daily., Starting Sat 09/14/2016, Normal    predniSONE (DELTASONE) 20 MG tablet Two daily with food, Normal         Elvina SidleKurt Ensley Blas, MD 09/14/16 1912    Elvina SidleKurt Trena Dunavan, MD 09/14/16 [id number]    Elvina SidleKurt Naela Nodal, MD 09/14/16 Ernestina [id number]

## 2016-09-14 NOTE — ED Triage Notes (Signed)
See s/s.  Patient does not think she every really got over symptoms in February.   Patient reports symptoms have intensified and cough is interrupting sleep.

## 2016-09-14 NOTE — Discharge Instructions (Signed)
Please return if you're not better in 3 days. Continue to monitor your blood sugar.

## 2016-09-17 DIAGNOSIS — J4540 Moderate persistent asthma, uncomplicated: Secondary | ICD-10-CM | POA: Diagnosis not present

## 2016-09-17 DIAGNOSIS — J209 Acute bronchitis, unspecified: Secondary | ICD-10-CM | POA: Diagnosis not present

## 2016-09-17 DIAGNOSIS — J301 Allergic rhinitis due to pollen: Secondary | ICD-10-CM | POA: Diagnosis not present

## 2016-09-17 DIAGNOSIS — J3089 Other allergic rhinitis: Secondary | ICD-10-CM | POA: Diagnosis not present

## 2016-10-02 DIAGNOSIS — J3089 Other allergic rhinitis: Secondary | ICD-10-CM | POA: Diagnosis not present

## 2016-10-02 DIAGNOSIS — J3081 Allergic rhinitis due to animal (cat) (dog) hair and dander: Secondary | ICD-10-CM | POA: Diagnosis not present

## 2016-10-02 DIAGNOSIS — J301 Allergic rhinitis due to pollen: Secondary | ICD-10-CM | POA: Diagnosis not present

## 2016-10-09 DIAGNOSIS — J3081 Allergic rhinitis due to animal (cat) (dog) hair and dander: Secondary | ICD-10-CM | POA: Diagnosis not present

## 2016-10-09 DIAGNOSIS — J301 Allergic rhinitis due to pollen: Secondary | ICD-10-CM | POA: Diagnosis not present

## 2016-10-10 DIAGNOSIS — J3089 Other allergic rhinitis: Secondary | ICD-10-CM | POA: Diagnosis not present

## 2016-10-16 ENCOUNTER — Other Ambulatory Visit: Payer: Self-pay | Admitting: Obstetrics and Gynecology

## 2016-10-18 DIAGNOSIS — Z01419 Encounter for gynecological examination (general) (routine) without abnormal findings: Secondary | ICD-10-CM | POA: Diagnosis not present

## 2016-10-18 DIAGNOSIS — Z6834 Body mass index (BMI) 34.0-34.9, adult: Secondary | ICD-10-CM | POA: Diagnosis not present

## 2016-10-21 ENCOUNTER — Other Ambulatory Visit: Payer: Self-pay | Admitting: Obstetrics and Gynecology

## 2016-10-21 DIAGNOSIS — Z1231 Encounter for screening mammogram for malignant neoplasm of breast: Secondary | ICD-10-CM

## 2016-10-22 ENCOUNTER — Ambulatory Visit
Admission: RE | Admit: 2016-10-22 | Discharge: 2016-10-22 | Disposition: A | Discharge status: Home / Self Care | Payer: Federal, State, Local not specified - PPO | Source: Ambulatory Visit | Attending: Obstetrics and Gynecology | Admitting: Obstetrics and Gynecology

## 2016-10-22 DIAGNOSIS — E119 Type 2 diabetes mellitus without complications: Secondary | ICD-10-CM | POA: Diagnosis not present

## 2016-10-22 DIAGNOSIS — I129 Hypertensive chronic kidney disease with stage 1 through stage 4 chronic kidney disease, or unspecified chronic kidney disease: Secondary | ICD-10-CM | POA: Diagnosis not present

## 2016-10-22 DIAGNOSIS — E669 Obesity, unspecified: Secondary | ICD-10-CM | POA: Diagnosis not present

## 2016-10-22 DIAGNOSIS — Z1231 Encounter for screening mammogram for malignant neoplasm of breast: Secondary | ICD-10-CM | POA: Diagnosis not present

## 2016-10-22 DIAGNOSIS — F329 Major depressive disorder, single episode, unspecified: Secondary | ICD-10-CM | POA: Diagnosis not present

## 2016-11-01 DIAGNOSIS — J3081 Allergic rhinitis due to animal (cat) (dog) hair and dander: Secondary | ICD-10-CM | POA: Diagnosis not present

## 2016-11-01 DIAGNOSIS — J301 Allergic rhinitis due to pollen: Secondary | ICD-10-CM | POA: Diagnosis not present

## 2016-11-01 DIAGNOSIS — J3089 Other allergic rhinitis: Secondary | ICD-10-CM | POA: Diagnosis not present

## 2016-11-12 DIAGNOSIS — J3081 Allergic rhinitis due to animal (cat) (dog) hair and dander: Secondary | ICD-10-CM | POA: Diagnosis not present

## 2016-11-12 DIAGNOSIS — J301 Allergic rhinitis due to pollen: Secondary | ICD-10-CM | POA: Diagnosis not present

## 2016-11-12 DIAGNOSIS — J3089 Other allergic rhinitis: Secondary | ICD-10-CM | POA: Diagnosis not present

## 2016-11-19 DIAGNOSIS — J3089 Other allergic rhinitis: Secondary | ICD-10-CM | POA: Diagnosis not present

## 2016-11-19 DIAGNOSIS — J3081 Allergic rhinitis due to animal (cat) (dog) hair and dander: Secondary | ICD-10-CM | POA: Diagnosis not present

## 2016-11-19 DIAGNOSIS — J301 Allergic rhinitis due to pollen: Secondary | ICD-10-CM | POA: Diagnosis not present

## 2016-11-26 DIAGNOSIS — J3081 Allergic rhinitis due to animal (cat) (dog) hair and dander: Secondary | ICD-10-CM | POA: Diagnosis not present

## 2016-11-26 DIAGNOSIS — J3089 Other allergic rhinitis: Secondary | ICD-10-CM | POA: Diagnosis not present

## 2016-11-26 DIAGNOSIS — J301 Allergic rhinitis due to pollen: Secondary | ICD-10-CM | POA: Diagnosis not present

## 2016-11-30 ENCOUNTER — Emergency Department (HOSPITAL_COMMUNITY)
Admission: EM | Admit: 2016-11-30 | Discharge: 2016-11-30 | Disposition: A | Discharge status: Home / Self Care | Payer: Federal, State, Local not specified - PPO | Attending: Emergency Medicine | Admitting: Emergency Medicine

## 2016-11-30 ENCOUNTER — Encounter (HOSPITAL_COMMUNITY): Payer: Self-pay

## 2016-11-30 DIAGNOSIS — K0889 Other specified disorders of teeth and supporting structures: Secondary | ICD-10-CM | POA: Diagnosis not present

## 2016-11-30 MED ORDER — PENICILLIN V POTASSIUM 500 MG PO TABS: 500.0000 mg | ORAL_TABLET | Freq: Four times a day (QID) | ORAL | 0 refills | Status: DC

## 2016-11-30 NOTE — ED Notes (Signed)
Updated pt on wait for treatment room. 

## 2016-11-30 NOTE — ED Triage Notes (Signed)
Pt states she started having right side dental pain on yesterday and has caused increased HA; Pt c/o pain at 10/10 on arrival. Pt a&ox 4 on arrival. Pt states hx of migraines

## 2016-11-30 NOTE — ED Notes (Signed)
EDP at bedside  

## 2016-11-30 NOTE — ED Provider Notes (Signed)
[facility]-EMERGENCY DEPT Provider Note   CSN: [id number] Arrival date & time: 11/30/16  0104     History   Chief Complaint Chief Complaint  Patient presents with  . Dental Pain  . Headache    HPI Gloria Brooks is a 61 y.o. female.  Patient presents to the emergency department with chief complaint of dental pain. She states that she noticed that her right upper premolar was bothering her couple of days ago. It has become progressively more sensitive to temperature and chewing. She reports it is now causing her to have a headache. She denies any fevers chills. Denies any nausea, or vomiting. She is tried taking Tylenol no relief. She does have a dentist, but has been unable to get into see them given that it is the weekend. There are no other associated symptoms or modifying factors.   The history is provided by the patient. No language interpreter was used.    Past Medical History:  Diagnosis Date  . Allergy   . Arthritis   . Diabetes mellitus without complication (HCC)   . Environmental allergies   . Hyperlipidemia   . Hypertension   . Obesity     Patient Active Problem List   Diagnosis Date Noted  . Paresthesia 05/10/2016  . Obesity     Past Surgical History:  Procedure Laterality Date  . ABDOMINAL HYSTERECTOMY    . CESAREAN SECTION    . TUBAL LIGATION      OB History    No data available       Home Medications    Prior to Admission medications   Medication Sig Start Date End Date Taking? Authorizing Provider  amLODipine-atorvastatin (CADUET) 5-20 MG per tablet Take 1 tablet by mouth daily.    [provider]  Budesonide-Formoterol Fumarate (SYMBICORT IN) Inhale into the lungs as needed.     [provider]  HYDROcodone-homatropine (HYDROMET) 5-1.5 MG/5ML syrup Take 5 mLs by mouth every 6 (six) hours as needed for cough. 09/14/16   Elvina Sidle, MD  levofloxacin (LEVAQUIN) 500 MG tablet Take 1 tablet (500 mg total) by mouth daily.  09/14/16   Elvina Sidle, MD  mometasone (NASONEX) 50 MCG/ACT nasal spray Place 2 sprays into the nose as needed.     [provider]  predniSONE (DELTASONE) 20 MG tablet Two daily with food 09/14/16   Elvina Sidle, MD  sitaGLIPtin-metformin (JANUMET) 50-500 MG tablet Take 1 tablet by mouth 2 (two) times daily with a meal.    [provider]    Family History Family History  Problem Relation Age of Onset  . Hypertension Mother   . Arthritis Mother   . Hyperlipidemia Mother   . Diabetes Father   . Heart disease Father   . Obesity Sister   . Myocarditis Sister   . Breast cancer Maternal Aunt   . Breast cancer Cousin     Social History Social History  Substance Use Topics  . Smoking status: Never Smoker  . Smokeless tobacco: Never Used  . Alcohol use No     Allergies   Shellfish allergy; Naproxen; Food; and Mucinex [guaifenesin er]   Review of Systems Review of Systems  Constitutional: Negative for chills and fever.  HENT: Positive for dental problem. Negative for drooling.   Neurological: Negative for speech difficulty.  Psychiatric/Behavioral: Positive for sleep disturbance.     Physical Exam Updated Vital Signs BP (!) 161/88 (BP Location: Left Arm)   Pulse 72   Temp 98.2 F (  36.8 C) (Oral)   Resp (!) 24   SpO2 96%   Physical Exam Physical Exam  Constitutional: Pt appears well-developed and well-nourished.  HENT:  Head: Normocephalic.  Right Ear: Tympanic membrane, external ear and ear canal normal.  Left Ear: Tympanic membrane, external ear and ear canal normal.  Nose: Nose normal. Right sinus exhibits no maxillary sinus tenderness and no frontal sinus tenderness. Left sinus exhibits no maxillary sinus tenderness and no frontal sinus tenderness.  Mouth/Throat: Uvula is midline, oropharynx is clear and moist and mucous membranes are normal. No oral lesions. No uvula swelling or lacerations. No oropharyngeal exudate, posterior  oropharyngeal edema, posterior oropharyngeal erythema or tonsillar abscesses.  Poor dentition No gingival swelling, fluctuance or induration No gross abscess  No sublingual edema, tenderness to palpation, or sign of Ludwig's angina, or deep space infection Pain at right upper premolar Eyes: Conjunctivae are normal. Pupils are equal, round, and reactive to light. Right eye exhibits no discharge. Left eye exhibits no discharge.  Neck: Normal range of motion. Neck supple.  No stridor Handling secretions without difficulty No nuchal rigidity No cervical lymphadenopathy Cardiovascular: Normal rate, regular rhythm and normal heart sounds.   Pulmonary/Chest: Effort normal. No respiratory distress.  Equal chest rise  Abdominal: Soft. Bowel sounds are normal. Pt exhibits no distension. There is no tenderness.  Lymphadenopathy: Pt has no cervical adenopathy.  Neurological: Pt is alert and oriented x 4  Skin: Skin is warm and dry.  Psychiatric: Pt has a normal mood and affect.  Nursing note and vitals reviewed.    ED Treatments / Results  Labs (all labs ordered are listed, but only abnormal results are displayed) Labs Reviewed - No data to display  EKG  EKG Interpretation None       Radiology No results found.  Procedures Procedures (including critical care time)  Medications Ordered in ED Medications - No data to display   Initial Impression / Assessment and Plan / ED Course  I have reviewed the triage vital signs and the nursing notes.  Pertinent labs & imaging results that were available during my care of the patient were reviewed by me and considered in my medical decision making (see chart for details).     Patient with dentalgia.  No abscess requiring immediate incision and drainage.  Exam not concerning for Ludwig's angina or pharyngeal abscess.  Will treat with penicillin. Pt instructed to follow-up with dentist.  Discussed return precautions. Pt safe for  discharge.   Final Clinical Impressions(s) / ED Diagnoses   Final diagnoses:  Pain, dental    New Prescriptions New Prescriptions   No medications on file     Felipa FurnaceBrowning, Eloni Darius, PA-C 11/30/16 [phone]    Azalia Bilisampos, Kevin, MD 11/30/16 [phone]

## 2016-12-05 DIAGNOSIS — J301 Allergic rhinitis due to pollen: Secondary | ICD-10-CM | POA: Diagnosis not present

## 2016-12-05 DIAGNOSIS — J3089 Other allergic rhinitis: Secondary | ICD-10-CM | POA: Diagnosis not present

## 2016-12-05 DIAGNOSIS — K080 Exfoliation of teeth due to systemic causes: Secondary | ICD-10-CM | POA: Diagnosis not present

## 2016-12-06 DIAGNOSIS — M79602 Pain in left arm: Secondary | ICD-10-CM | POA: Diagnosis not present

## 2016-12-06 DIAGNOSIS — I10 Essential (primary) hypertension: Secondary | ICD-10-CM | POA: Diagnosis not present

## 2016-12-06 DIAGNOSIS — I129 Hypertensive chronic kidney disease with stage 1 through stage 4 chronic kidney disease, or unspecified chronic kidney disease: Secondary | ICD-10-CM | POA: Diagnosis not present

## 2016-12-13 DIAGNOSIS — J3081 Allergic rhinitis due to animal (cat) (dog) hair and dander: Secondary | ICD-10-CM | POA: Diagnosis not present

## 2016-12-13 DIAGNOSIS — J3089 Other allergic rhinitis: Secondary | ICD-10-CM | POA: Diagnosis not present

## 2016-12-13 DIAGNOSIS — J301 Allergic rhinitis due to pollen: Secondary | ICD-10-CM | POA: Diagnosis not present

## 2016-12-18 DIAGNOSIS — J3089 Other allergic rhinitis: Secondary | ICD-10-CM | POA: Diagnosis not present

## 2016-12-18 DIAGNOSIS — J3081 Allergic rhinitis due to animal (cat) (dog) hair and dander: Secondary | ICD-10-CM | POA: Diagnosis not present

## 2016-12-18 DIAGNOSIS — J301 Allergic rhinitis due to pollen: Secondary | ICD-10-CM | POA: Diagnosis not present

## 2016-12-26 DIAGNOSIS — K080 Exfoliation of teeth due to systemic causes: Secondary | ICD-10-CM | POA: Diagnosis not present

## 2017-01-02 DIAGNOSIS — J3089 Other allergic rhinitis: Secondary | ICD-10-CM | POA: Diagnosis not present

## 2017-01-02 DIAGNOSIS — J3081 Allergic rhinitis due to animal (cat) (dog) hair and dander: Secondary | ICD-10-CM | POA: Diagnosis not present

## 2017-01-02 DIAGNOSIS — J301 Allergic rhinitis due to pollen: Secondary | ICD-10-CM | POA: Diagnosis not present

## 2017-01-08 DIAGNOSIS — J3089 Other allergic rhinitis: Secondary | ICD-10-CM | POA: Diagnosis not present

## 2017-01-08 DIAGNOSIS — J3081 Allergic rhinitis due to animal (cat) (dog) hair and dander: Secondary | ICD-10-CM | POA: Diagnosis not present

## 2017-01-08 DIAGNOSIS — J301 Allergic rhinitis due to pollen: Secondary | ICD-10-CM | POA: Diagnosis not present

## 2017-01-27 DIAGNOSIS — K080 Exfoliation of teeth due to systemic causes: Secondary | ICD-10-CM | POA: Diagnosis not present

## 2017-01-27 DIAGNOSIS — J301 Allergic rhinitis due to pollen: Secondary | ICD-10-CM | POA: Diagnosis not present

## 2017-01-27 DIAGNOSIS — J3089 Other allergic rhinitis: Secondary | ICD-10-CM | POA: Diagnosis not present

## 2017-01-27 DIAGNOSIS — J3081 Allergic rhinitis due to animal (cat) (dog) hair and dander: Secondary | ICD-10-CM | POA: Diagnosis not present

## 2017-01-29 DIAGNOSIS — J3089 Other allergic rhinitis: Secondary | ICD-10-CM | POA: Diagnosis not present

## 2017-01-29 DIAGNOSIS — J301 Allergic rhinitis due to pollen: Secondary | ICD-10-CM | POA: Diagnosis not present

## 2017-01-29 DIAGNOSIS — J4540 Moderate persistent asthma, uncomplicated: Secondary | ICD-10-CM | POA: Diagnosis not present

## 2017-01-29 DIAGNOSIS — Z91013 Allergy to seafood: Secondary | ICD-10-CM | POA: Diagnosis not present

## 2017-02-07 DIAGNOSIS — J301 Allergic rhinitis due to pollen: Secondary | ICD-10-CM | POA: Diagnosis not present

## 2017-02-07 DIAGNOSIS — J3089 Other allergic rhinitis: Secondary | ICD-10-CM | POA: Diagnosis not present

## 2017-02-07 DIAGNOSIS — J3081 Allergic rhinitis due to animal (cat) (dog) hair and dander: Secondary | ICD-10-CM | POA: Diagnosis not present

## 2017-02-12 DIAGNOSIS — J3089 Other allergic rhinitis: Secondary | ICD-10-CM | POA: Diagnosis not present

## 2017-02-12 DIAGNOSIS — J301 Allergic rhinitis due to pollen: Secondary | ICD-10-CM | POA: Diagnosis not present

## 2017-02-12 DIAGNOSIS — J3081 Allergic rhinitis due to animal (cat) (dog) hair and dander: Secondary | ICD-10-CM | POA: Diagnosis not present

## 2017-02-28 DIAGNOSIS — J3089 Other allergic rhinitis: Secondary | ICD-10-CM | POA: Diagnosis not present

## 2017-02-28 DIAGNOSIS — J301 Allergic rhinitis due to pollen: Secondary | ICD-10-CM | POA: Diagnosis not present

## 2017-02-28 DIAGNOSIS — J3081 Allergic rhinitis due to animal (cat) (dog) hair and dander: Secondary | ICD-10-CM | POA: Diagnosis not present

## 2017-03-04 DIAGNOSIS — N390 Urinary tract infection, site not specified: Secondary | ICD-10-CM | POA: Diagnosis not present

## 2017-03-04 DIAGNOSIS — E559 Vitamin D deficiency, unspecified: Secondary | ICD-10-CM | POA: Diagnosis not present

## 2017-03-04 DIAGNOSIS — E119 Type 2 diabetes mellitus without complications: Secondary | ICD-10-CM | POA: Diagnosis not present

## 2017-03-04 DIAGNOSIS — Z0000 Encounter for general adult medical examination without abnormal findings: Secondary | ICD-10-CM | POA: Diagnosis not present

## 2017-03-04 DIAGNOSIS — R8299 Other abnormal findings in urine: Secondary | ICD-10-CM | POA: Diagnosis not present

## 2017-03-07 ENCOUNTER — Ambulatory Visit: Payer: Federal, State, Local not specified - PPO | Admitting: Podiatry

## 2017-03-07 DIAGNOSIS — I129 Hypertensive chronic kidney disease with stage 1 through stage 4 chronic kidney disease, or unspecified chronic kidney disease: Secondary | ICD-10-CM | POA: Diagnosis not present

## 2017-03-07 DIAGNOSIS — E784 Other hyperlipidemia: Secondary | ICD-10-CM | POA: Diagnosis not present

## 2017-03-07 DIAGNOSIS — Z0000 Encounter for general adult medical examination without abnormal findings: Secondary | ICD-10-CM | POA: Diagnosis not present

## 2017-03-07 DIAGNOSIS — E668 Other obesity: Secondary | ICD-10-CM | POA: Diagnosis not present

## 2017-03-07 DIAGNOSIS — E119 Type 2 diabetes mellitus without complications: Secondary | ICD-10-CM | POA: Diagnosis not present

## 2017-03-11 DIAGNOSIS — E669 Obesity, unspecified: Secondary | ICD-10-CM | POA: Diagnosis not present

## 2017-03-11 DIAGNOSIS — Z7189 Other specified counseling: Secondary | ICD-10-CM | POA: Diagnosis not present

## 2017-03-11 DIAGNOSIS — Z6835 Body mass index (BMI) 35.0-35.9, adult: Secondary | ICD-10-CM | POA: Diagnosis not present

## 2017-03-11 DIAGNOSIS — E119 Type 2 diabetes mellitus without complications: Secondary | ICD-10-CM | POA: Diagnosis not present

## 2017-03-21 DIAGNOSIS — J3089 Other allergic rhinitis: Secondary | ICD-10-CM | POA: Diagnosis not present

## 2017-03-21 DIAGNOSIS — J3081 Allergic rhinitis due to animal (cat) (dog) hair and dander: Secondary | ICD-10-CM | POA: Diagnosis not present

## 2017-03-21 DIAGNOSIS — J301 Allergic rhinitis due to pollen: Secondary | ICD-10-CM | POA: Diagnosis not present

## 2017-04-07 DIAGNOSIS — J301 Allergic rhinitis due to pollen: Secondary | ICD-10-CM | POA: Diagnosis not present

## 2017-04-07 DIAGNOSIS — E119 Type 2 diabetes mellitus without complications: Secondary | ICD-10-CM | POA: Diagnosis not present

## 2017-04-07 DIAGNOSIS — E669 Obesity, unspecified: Secondary | ICD-10-CM | POA: Diagnosis not present

## 2017-04-07 DIAGNOSIS — Z6833 Body mass index (BMI) 33.0-33.9, adult: Secondary | ICD-10-CM | POA: Diagnosis not present

## 2017-04-07 DIAGNOSIS — J3089 Other allergic rhinitis: Secondary | ICD-10-CM | POA: Diagnosis not present

## 2017-04-07 DIAGNOSIS — J3081 Allergic rhinitis due to animal (cat) (dog) hair and dander: Secondary | ICD-10-CM | POA: Diagnosis not present

## 2017-04-08 ENCOUNTER — Encounter: Payer: Self-pay | Admitting: Sports Medicine

## 2017-04-08 ENCOUNTER — Ambulatory Visit (INDEPENDENT_AMBULATORY_CARE_PROVIDER_SITE_OTHER): Payer: Federal, State, Local not specified - PPO | Admitting: Sports Medicine

## 2017-04-08 DIAGNOSIS — E119 Type 2 diabetes mellitus without complications: Secondary | ICD-10-CM | POA: Diagnosis not present

## 2017-04-08 DIAGNOSIS — L84 Corns and callosities: Secondary | ICD-10-CM

## 2017-04-08 DIAGNOSIS — M79671 Pain in right foot: Secondary | ICD-10-CM

## 2017-04-08 NOTE — Progress Notes (Signed)
Patient ID: Gloria Brooks, female   DOB: 08-11-1955, 61 y.o.   MRN: [id number] Subjective: Gloria Brooks is a 61 y.o. female patient with history of diabetes who presents to office today complaining of  painful callus at bottom of right foot. States that she has been using stone that helps. Patient denies any other issues.   Patient Active Problem List   Diagnosis Date Noted  . Paresthesia 05/10/2016  . Obesity    Current Outpatient Prescriptions on File Prior to Visit  Medication Sig Dispense Refill  . amLODipine-atorvastatin (CADUET) 5-20 MG per tablet Take 1 tablet by mouth daily.    . Budesonide-Formoterol Fumarate (SYMBICORT IN) Inhale into the lungs as needed.     Marland Kitchen levofloxacin (LEVAQUIN) 500 MG tablet Take 1 tablet (500 mg total) by mouth daily. 7 tablet 0  . mometasone (NASONEX) 50 MCG/ACT nasal spray Place 2 sprays into the nose as needed.     . penicillin v potassium (VEETID) 500 MG tablet Take 1 tablet (500 mg total) by mouth 4 (four) times daily. 40 tablet 0  . sitaGLIPtin-metformin (JANUMET) 50-500 MG tablet Take 1 tablet by mouth 2 (two) times daily with a meal.     Current Facility-Administered Medications on File Prior to Visit  Medication Dose Route Frequency Provider Last Rate Last Dose  . triamcinolone acetonide (KENALOG) 10 MG/ML injection 10 mg  10 mg Other Once Asencion Islam, DPM       Allergies  Allergen Reactions  . Shellfish Allergy Anaphylaxis  . Naproxen Itching and Other (See Comments)    Associated with other pain meds as well  . Food     Shrimp  . Mucinex [Guaifenesin Er] Swelling    Pt reports throat swelling    No results found for this or any previous visit (from the past 2160 hour(s)).  Objective: General: Patient is awake, alert, and oriented x 3 and in no acute distress.  Integument: Skin is warm, dry and supple bilateral. Nails are short, thickened and  dystrophic with subungual debris, consistent with onychomycosis, 1-5 bilateral. No  signs of infection. No open lesions bilateral. + callus right plantar midfoot with nucleated core resembling porokeratosis. Remaining integument unremarkable.  Vasculature:  Dorsalis Pedis pulse 2/4 bilateral. Posterior Tibial pulse  1/4 bilateral.  Capillary fill time <3 sec 1-5 bilateral. Positive hair growth to the level of the digits. Temperature gradient within normal limits. No varicosities present bilateral. No edema present bilateral.   Neurology: The patient has intact sensation measured with a 5.07/10g Semmes Weinstein Monofilament at all pedal sites bilateral . Vibratory sensation intact bilateral with tuning fork. No Babinski sign present bilateral.   Musculoskeletal: Mild tenderness to site of keratosis on right. Pes planus foot type noted bilateral. Muscular strength 5/5 in all lower extremity muscular groups bilateral without pain on range of motion . No tenderness with calf compression bilateral.   Assessment and Plan: Problem List Items Addressed This Visit    None    Visit Diagnoses    Pre-ulcerative corn or callous    -  Primary   Right foot pain       Diabetes mellitus without complication (HCC)         -Examined patient. -Discussed and educated patient on diabetic foot care, especially with  regards to the vascular, neurological and musculoskeletal systems.  -Stressed the importance of good glycemic control and the detriment of not  controlling glucose levels in relation to the foot. -Mechanically debrided callus x 1  using sterile chisel blade without incident  -Recommend daily skin emollients and exfoliation  -Patient to return as needed for routine foot care  -Patient advised to call the office if any problems or questions arise in the meantime.  Asencion Islam, DPM

## 2017-05-09 DIAGNOSIS — J3089 Other allergic rhinitis: Secondary | ICD-10-CM | POA: Diagnosis not present

## 2017-05-09 DIAGNOSIS — J301 Allergic rhinitis due to pollen: Secondary | ICD-10-CM | POA: Diagnosis not present

## 2017-05-09 DIAGNOSIS — J3081 Allergic rhinitis due to animal (cat) (dog) hair and dander: Secondary | ICD-10-CM | POA: Diagnosis not present

## 2017-05-16 DIAGNOSIS — J3081 Allergic rhinitis due to animal (cat) (dog) hair and dander: Secondary | ICD-10-CM | POA: Diagnosis not present

## 2017-05-16 DIAGNOSIS — J301 Allergic rhinitis due to pollen: Secondary | ICD-10-CM | POA: Diagnosis not present

## 2017-05-16 DIAGNOSIS — J3089 Other allergic rhinitis: Secondary | ICD-10-CM | POA: Diagnosis not present

## 2017-06-13 DIAGNOSIS — J3089 Other allergic rhinitis: Secondary | ICD-10-CM | POA: Diagnosis not present

## 2017-06-13 DIAGNOSIS — J3081 Allergic rhinitis due to animal (cat) (dog) hair and dander: Secondary | ICD-10-CM | POA: Diagnosis not present

## 2017-06-13 DIAGNOSIS — J301 Allergic rhinitis due to pollen: Secondary | ICD-10-CM | POA: Diagnosis not present

## 2017-06-18 DIAGNOSIS — J301 Allergic rhinitis due to pollen: Secondary | ICD-10-CM | POA: Diagnosis not present

## 2017-06-18 DIAGNOSIS — J3089 Other allergic rhinitis: Secondary | ICD-10-CM | POA: Diagnosis not present

## 2017-06-18 DIAGNOSIS — J3081 Allergic rhinitis due to animal (cat) (dog) hair and dander: Secondary | ICD-10-CM | POA: Diagnosis not present

## 2017-06-27 DIAGNOSIS — J3081 Allergic rhinitis due to animal (cat) (dog) hair and dander: Secondary | ICD-10-CM | POA: Diagnosis not present

## 2017-06-27 DIAGNOSIS — J3089 Other allergic rhinitis: Secondary | ICD-10-CM | POA: Diagnosis not present

## 2017-06-27 DIAGNOSIS — J301 Allergic rhinitis due to pollen: Secondary | ICD-10-CM | POA: Diagnosis not present

## 2017-07-03 DIAGNOSIS — J3089 Other allergic rhinitis: Secondary | ICD-10-CM | POA: Diagnosis not present

## 2017-07-03 DIAGNOSIS — J301 Allergic rhinitis due to pollen: Secondary | ICD-10-CM | POA: Diagnosis not present

## 2017-07-03 DIAGNOSIS — J3081 Allergic rhinitis due to animal (cat) (dog) hair and dander: Secondary | ICD-10-CM | POA: Diagnosis not present

## 2017-07-11 DIAGNOSIS — Z90710 Acquired absence of both cervix and uterus: Secondary | ICD-10-CM | POA: Diagnosis not present

## 2017-07-11 DIAGNOSIS — M545 Low back pain: Secondary | ICD-10-CM | POA: Diagnosis not present

## 2017-07-11 DIAGNOSIS — J301 Allergic rhinitis due to pollen: Secondary | ICD-10-CM | POA: Diagnosis not present

## 2017-07-11 DIAGNOSIS — J3089 Other allergic rhinitis: Secondary | ICD-10-CM | POA: Diagnosis not present

## 2017-07-11 DIAGNOSIS — J3081 Allergic rhinitis due to animal (cat) (dog) hair and dander: Secondary | ICD-10-CM | POA: Diagnosis not present

## 2017-07-11 DIAGNOSIS — R300 Dysuria: Secondary | ICD-10-CM | POA: Diagnosis not present

## 2017-07-18 DIAGNOSIS — J3089 Other allergic rhinitis: Secondary | ICD-10-CM | POA: Diagnosis not present

## 2017-07-18 DIAGNOSIS — J3081 Allergic rhinitis due to animal (cat) (dog) hair and dander: Secondary | ICD-10-CM | POA: Diagnosis not present

## 2017-07-18 DIAGNOSIS — J301 Allergic rhinitis due to pollen: Secondary | ICD-10-CM | POA: Diagnosis not present

## 2017-07-23 DIAGNOSIS — F3289 Other specified depressive episodes: Secondary | ICD-10-CM | POA: Diagnosis not present

## 2017-07-23 DIAGNOSIS — I129 Hypertensive chronic kidney disease with stage 1 through stage 4 chronic kidney disease, or unspecified chronic kidney disease: Secondary | ICD-10-CM | POA: Diagnosis not present

## 2017-07-23 DIAGNOSIS — J301 Allergic rhinitis due to pollen: Secondary | ICD-10-CM | POA: Diagnosis not present

## 2017-07-23 DIAGNOSIS — J3081 Allergic rhinitis due to animal (cat) (dog) hair and dander: Secondary | ICD-10-CM | POA: Diagnosis not present

## 2017-07-23 DIAGNOSIS — Z1389 Encounter for screening for other disorder: Secondary | ICD-10-CM | POA: Diagnosis not present

## 2017-07-23 DIAGNOSIS — E119 Type 2 diabetes mellitus without complications: Secondary | ICD-10-CM | POA: Diagnosis not present

## 2017-07-23 DIAGNOSIS — J3089 Other allergic rhinitis: Secondary | ICD-10-CM | POA: Diagnosis not present

## 2017-07-23 DIAGNOSIS — E668 Other obesity: Secondary | ICD-10-CM | POA: Diagnosis not present

## 2017-08-02 DIAGNOSIS — J111 Influenza due to unidentified influenza virus with other respiratory manifestations: Secondary | ICD-10-CM | POA: Diagnosis not present

## 2017-08-02 DIAGNOSIS — J029 Acute pharyngitis, unspecified: Secondary | ICD-10-CM | POA: Diagnosis not present

## 2017-08-12 DIAGNOSIS — J301 Allergic rhinitis due to pollen: Secondary | ICD-10-CM | POA: Diagnosis not present

## 2017-08-12 DIAGNOSIS — J3081 Allergic rhinitis due to animal (cat) (dog) hair and dander: Secondary | ICD-10-CM | POA: Diagnosis not present

## 2017-08-12 DIAGNOSIS — K080 Exfoliation of teeth due to systemic causes: Secondary | ICD-10-CM | POA: Diagnosis not present

## 2017-08-12 DIAGNOSIS — J3089 Other allergic rhinitis: Secondary | ICD-10-CM | POA: Diagnosis not present

## 2017-08-18 DIAGNOSIS — J301 Allergic rhinitis due to pollen: Secondary | ICD-10-CM | POA: Diagnosis not present

## 2017-08-18 DIAGNOSIS — J3089 Other allergic rhinitis: Secondary | ICD-10-CM | POA: Diagnosis not present

## 2017-08-26 DIAGNOSIS — J3081 Allergic rhinitis due to animal (cat) (dog) hair and dander: Secondary | ICD-10-CM | POA: Diagnosis not present

## 2017-08-26 DIAGNOSIS — J301 Allergic rhinitis due to pollen: Secondary | ICD-10-CM | POA: Diagnosis not present

## 2017-08-26 DIAGNOSIS — J3089 Other allergic rhinitis: Secondary | ICD-10-CM | POA: Diagnosis not present

## 2017-08-29 DIAGNOSIS — K5909 Other constipation: Secondary | ICD-10-CM | POA: Diagnosis not present

## 2017-08-29 DIAGNOSIS — M549 Dorsalgia, unspecified: Secondary | ICD-10-CM | POA: Diagnosis not present

## 2017-08-29 DIAGNOSIS — Z6829 Body mass index (BMI) 29.0-29.9, adult: Secondary | ICD-10-CM | POA: Diagnosis not present

## 2017-08-29 DIAGNOSIS — R8290 Unspecified abnormal findings in urine: Secondary | ICD-10-CM | POA: Diagnosis not present

## 2017-08-29 DIAGNOSIS — N390 Urinary tract infection, site not specified: Secondary | ICD-10-CM | POA: Diagnosis not present

## 2017-09-04 DIAGNOSIS — J3081 Allergic rhinitis due to animal (cat) (dog) hair and dander: Secondary | ICD-10-CM | POA: Diagnosis not present

## 2017-09-04 DIAGNOSIS — J3089 Other allergic rhinitis: Secondary | ICD-10-CM | POA: Diagnosis not present

## 2017-09-04 DIAGNOSIS — M50122 Cervical disc disorder at C5-C6 level with radiculopathy: Secondary | ICD-10-CM | POA: Diagnosis not present

## 2017-09-04 DIAGNOSIS — M9901 Segmental and somatic dysfunction of cervical region: Secondary | ICD-10-CM | POA: Diagnosis not present

## 2017-09-04 DIAGNOSIS — M546 Pain in thoracic spine: Secondary | ICD-10-CM | POA: Diagnosis not present

## 2017-09-04 DIAGNOSIS — J301 Allergic rhinitis due to pollen: Secondary | ICD-10-CM | POA: Diagnosis not present

## 2017-09-04 DIAGNOSIS — M9902 Segmental and somatic dysfunction of thoracic region: Secondary | ICD-10-CM | POA: Diagnosis not present

## 2017-09-05 DIAGNOSIS — J301 Allergic rhinitis due to pollen: Secondary | ICD-10-CM | POA: Diagnosis not present

## 2017-09-05 DIAGNOSIS — J3081 Allergic rhinitis due to animal (cat) (dog) hair and dander: Secondary | ICD-10-CM | POA: Diagnosis not present

## 2017-09-08 DIAGNOSIS — J3089 Other allergic rhinitis: Secondary | ICD-10-CM | POA: Diagnosis not present

## 2017-09-12 ENCOUNTER — Other Ambulatory Visit: Payer: Self-pay | Admitting: Obstetrics and Gynecology

## 2017-09-12 DIAGNOSIS — J3089 Other allergic rhinitis: Secondary | ICD-10-CM | POA: Diagnosis not present

## 2017-09-12 DIAGNOSIS — J301 Allergic rhinitis due to pollen: Secondary | ICD-10-CM | POA: Diagnosis not present

## 2017-09-12 DIAGNOSIS — J3081 Allergic rhinitis due to animal (cat) (dog) hair and dander: Secondary | ICD-10-CM | POA: Diagnosis not present

## 2017-09-12 DIAGNOSIS — Z139 Encounter for screening, unspecified: Secondary | ICD-10-CM

## 2017-09-16 DIAGNOSIS — J301 Allergic rhinitis due to pollen: Secondary | ICD-10-CM | POA: Diagnosis not present

## 2017-09-16 DIAGNOSIS — J3081 Allergic rhinitis due to animal (cat) (dog) hair and dander: Secondary | ICD-10-CM | POA: Diagnosis not present

## 2017-09-16 DIAGNOSIS — J3089 Other allergic rhinitis: Secondary | ICD-10-CM | POA: Diagnosis not present

## 2017-09-18 DIAGNOSIS — M9901 Segmental and somatic dysfunction of cervical region: Secondary | ICD-10-CM | POA: Diagnosis not present

## 2017-09-18 DIAGNOSIS — M9902 Segmental and somatic dysfunction of thoracic region: Secondary | ICD-10-CM | POA: Diagnosis not present

## 2017-09-18 DIAGNOSIS — M50122 Cervical disc disorder at C5-C6 level with radiculopathy: Secondary | ICD-10-CM | POA: Diagnosis not present

## 2017-09-18 DIAGNOSIS — M546 Pain in thoracic spine: Secondary | ICD-10-CM | POA: Diagnosis not present

## 2017-09-24 DIAGNOSIS — J3081 Allergic rhinitis due to animal (cat) (dog) hair and dander: Secondary | ICD-10-CM | POA: Diagnosis not present

## 2017-09-24 DIAGNOSIS — J3089 Other allergic rhinitis: Secondary | ICD-10-CM | POA: Diagnosis not present

## 2017-09-24 DIAGNOSIS — J301 Allergic rhinitis due to pollen: Secondary | ICD-10-CM | POA: Diagnosis not present

## 2017-09-25 DIAGNOSIS — M50122 Cervical disc disorder at C5-C6 level with radiculopathy: Secondary | ICD-10-CM | POA: Diagnosis not present

## 2017-09-25 DIAGNOSIS — M9902 Segmental and somatic dysfunction of thoracic region: Secondary | ICD-10-CM | POA: Diagnosis not present

## 2017-09-25 DIAGNOSIS — M546 Pain in thoracic spine: Secondary | ICD-10-CM | POA: Diagnosis not present

## 2017-09-25 DIAGNOSIS — M9901 Segmental and somatic dysfunction of cervical region: Secondary | ICD-10-CM | POA: Diagnosis not present

## 2017-10-02 DIAGNOSIS — M9901 Segmental and somatic dysfunction of cervical region: Secondary | ICD-10-CM | POA: Diagnosis not present

## 2017-10-02 DIAGNOSIS — M9902 Segmental and somatic dysfunction of thoracic region: Secondary | ICD-10-CM | POA: Diagnosis not present

## 2017-10-02 DIAGNOSIS — M50122 Cervical disc disorder at C5-C6 level with radiculopathy: Secondary | ICD-10-CM | POA: Diagnosis not present

## 2017-10-02 DIAGNOSIS — M546 Pain in thoracic spine: Secondary | ICD-10-CM | POA: Diagnosis not present

## 2017-10-08 DIAGNOSIS — J3089 Other allergic rhinitis: Secondary | ICD-10-CM | POA: Diagnosis not present

## 2017-10-08 DIAGNOSIS — J3081 Allergic rhinitis due to animal (cat) (dog) hair and dander: Secondary | ICD-10-CM | POA: Diagnosis not present

## 2017-10-08 DIAGNOSIS — J301 Allergic rhinitis due to pollen: Secondary | ICD-10-CM | POA: Diagnosis not present

## 2017-10-09 DIAGNOSIS — M9902 Segmental and somatic dysfunction of thoracic region: Secondary | ICD-10-CM | POA: Diagnosis not present

## 2017-10-09 DIAGNOSIS — M50122 Cervical disc disorder at C5-C6 level with radiculopathy: Secondary | ICD-10-CM | POA: Diagnosis not present

## 2017-10-09 DIAGNOSIS — M9901 Segmental and somatic dysfunction of cervical region: Secondary | ICD-10-CM | POA: Diagnosis not present

## 2017-10-09 DIAGNOSIS — M546 Pain in thoracic spine: Secondary | ICD-10-CM | POA: Diagnosis not present

## 2017-10-15 DIAGNOSIS — J301 Allergic rhinitis due to pollen: Secondary | ICD-10-CM | POA: Diagnosis not present

## 2017-10-15 DIAGNOSIS — J3089 Other allergic rhinitis: Secondary | ICD-10-CM | POA: Diagnosis not present

## 2017-10-15 DIAGNOSIS — J3081 Allergic rhinitis due to animal (cat) (dog) hair and dander: Secondary | ICD-10-CM | POA: Diagnosis not present

## 2017-10-16 DIAGNOSIS — M546 Pain in thoracic spine: Secondary | ICD-10-CM | POA: Diagnosis not present

## 2017-10-16 DIAGNOSIS — M9901 Segmental and somatic dysfunction of cervical region: Secondary | ICD-10-CM | POA: Diagnosis not present

## 2017-10-16 DIAGNOSIS — M50122 Cervical disc disorder at C5-C6 level with radiculopathy: Secondary | ICD-10-CM | POA: Diagnosis not present

## 2017-10-16 DIAGNOSIS — M9902 Segmental and somatic dysfunction of thoracic region: Secondary | ICD-10-CM | POA: Diagnosis not present

## 2017-10-20 DIAGNOSIS — J3081 Allergic rhinitis due to animal (cat) (dog) hair and dander: Secondary | ICD-10-CM | POA: Diagnosis not present

## 2017-10-20 DIAGNOSIS — J301 Allergic rhinitis due to pollen: Secondary | ICD-10-CM | POA: Diagnosis not present

## 2017-10-20 DIAGNOSIS — J3089 Other allergic rhinitis: Secondary | ICD-10-CM | POA: Diagnosis not present

## 2017-10-23 DIAGNOSIS — M9901 Segmental and somatic dysfunction of cervical region: Secondary | ICD-10-CM | POA: Diagnosis not present

## 2017-10-23 DIAGNOSIS — M546 Pain in thoracic spine: Secondary | ICD-10-CM | POA: Diagnosis not present

## 2017-10-23 DIAGNOSIS — M9902 Segmental and somatic dysfunction of thoracic region: Secondary | ICD-10-CM | POA: Diagnosis not present

## 2017-10-23 DIAGNOSIS — M50122 Cervical disc disorder at C5-C6 level with radiculopathy: Secondary | ICD-10-CM | POA: Diagnosis not present

## 2017-10-24 ENCOUNTER — Ambulatory Visit
Admission: RE | Admit: 2017-10-24 | Discharge: 2017-10-24 | Disposition: A | Discharge status: Home / Self Care | Payer: Federal, State, Local not specified - PPO | Source: Ambulatory Visit | Attending: Obstetrics and Gynecology | Admitting: Obstetrics and Gynecology

## 2017-10-24 DIAGNOSIS — Z1231 Encounter for screening mammogram for malignant neoplasm of breast: Secondary | ICD-10-CM | POA: Diagnosis not present

## 2017-10-24 DIAGNOSIS — Z139 Encounter for screening, unspecified: Secondary | ICD-10-CM

## 2017-10-28 DIAGNOSIS — J3089 Other allergic rhinitis: Secondary | ICD-10-CM | POA: Diagnosis not present

## 2017-10-28 DIAGNOSIS — J301 Allergic rhinitis due to pollen: Secondary | ICD-10-CM | POA: Diagnosis not present

## 2017-10-30 DIAGNOSIS — M546 Pain in thoracic spine: Secondary | ICD-10-CM | POA: Diagnosis not present

## 2017-10-30 DIAGNOSIS — M50122 Cervical disc disorder at C5-C6 level with radiculopathy: Secondary | ICD-10-CM | POA: Diagnosis not present

## 2017-10-30 DIAGNOSIS — M9902 Segmental and somatic dysfunction of thoracic region: Secondary | ICD-10-CM | POA: Diagnosis not present

## 2017-10-30 DIAGNOSIS — M9901 Segmental and somatic dysfunction of cervical region: Secondary | ICD-10-CM | POA: Diagnosis not present

## 2017-11-07 DIAGNOSIS — J3089 Other allergic rhinitis: Secondary | ICD-10-CM | POA: Diagnosis not present

## 2017-11-07 DIAGNOSIS — J3081 Allergic rhinitis due to animal (cat) (dog) hair and dander: Secondary | ICD-10-CM | POA: Diagnosis not present

## 2017-11-07 DIAGNOSIS — J301 Allergic rhinitis due to pollen: Secondary | ICD-10-CM | POA: Diagnosis not present

## 2017-11-13 DIAGNOSIS — M50122 Cervical disc disorder at C5-C6 level with radiculopathy: Secondary | ICD-10-CM | POA: Diagnosis not present

## 2017-11-13 DIAGNOSIS — M9902 Segmental and somatic dysfunction of thoracic region: Secondary | ICD-10-CM | POA: Diagnosis not present

## 2017-11-13 DIAGNOSIS — M546 Pain in thoracic spine: Secondary | ICD-10-CM | POA: Diagnosis not present

## 2017-11-13 DIAGNOSIS — M9901 Segmental and somatic dysfunction of cervical region: Secondary | ICD-10-CM | POA: Diagnosis not present

## 2017-11-14 DIAGNOSIS — J3089 Other allergic rhinitis: Secondary | ICD-10-CM | POA: Diagnosis not present

## 2017-11-14 DIAGNOSIS — J3081 Allergic rhinitis due to animal (cat) (dog) hair and dander: Secondary | ICD-10-CM | POA: Diagnosis not present

## 2017-11-14 DIAGNOSIS — J301 Allergic rhinitis due to pollen: Secondary | ICD-10-CM | POA: Diagnosis not present

## 2017-11-20 DIAGNOSIS — M9901 Segmental and somatic dysfunction of cervical region: Secondary | ICD-10-CM | POA: Diagnosis not present

## 2017-11-20 DIAGNOSIS — M9902 Segmental and somatic dysfunction of thoracic region: Secondary | ICD-10-CM | POA: Diagnosis not present

## 2017-11-20 DIAGNOSIS — M50122 Cervical disc disorder at C5-C6 level with radiculopathy: Secondary | ICD-10-CM | POA: Diagnosis not present

## 2017-11-20 DIAGNOSIS — M546 Pain in thoracic spine: Secondary | ICD-10-CM | POA: Diagnosis not present

## 2017-11-21 DIAGNOSIS — J3081 Allergic rhinitis due to animal (cat) (dog) hair and dander: Secondary | ICD-10-CM | POA: Diagnosis not present

## 2017-11-21 DIAGNOSIS — J3089 Other allergic rhinitis: Secondary | ICD-10-CM | POA: Diagnosis not present

## 2017-11-21 DIAGNOSIS — J301 Allergic rhinitis due to pollen: Secondary | ICD-10-CM | POA: Diagnosis not present

## 2017-11-26 DIAGNOSIS — J301 Allergic rhinitis due to pollen: Secondary | ICD-10-CM | POA: Diagnosis not present

## 2017-11-26 DIAGNOSIS — J3081 Allergic rhinitis due to animal (cat) (dog) hair and dander: Secondary | ICD-10-CM | POA: Diagnosis not present

## 2017-11-26 DIAGNOSIS — J3089 Other allergic rhinitis: Secondary | ICD-10-CM | POA: Diagnosis not present

## 2017-12-11 DIAGNOSIS — M549 Dorsalgia, unspecified: Secondary | ICD-10-CM | POA: Diagnosis not present

## 2017-12-11 DIAGNOSIS — I10 Essential (primary) hypertension: Secondary | ICD-10-CM | POA: Diagnosis not present

## 2017-12-11 DIAGNOSIS — E119 Type 2 diabetes mellitus without complications: Secondary | ICD-10-CM | POA: Diagnosis not present

## 2017-12-11 DIAGNOSIS — I129 Hypertensive chronic kidney disease with stage 1 through stage 4 chronic kidney disease, or unspecified chronic kidney disease: Secondary | ICD-10-CM | POA: Diagnosis not present

## 2017-12-19 DIAGNOSIS — R05 Cough: Secondary | ICD-10-CM | POA: Diagnosis not present

## 2017-12-19 DIAGNOSIS — J069 Acute upper respiratory infection, unspecified: Secondary | ICD-10-CM | POA: Diagnosis not present

## 2018-01-09 DIAGNOSIS — J3081 Allergic rhinitis due to animal (cat) (dog) hair and dander: Secondary | ICD-10-CM | POA: Diagnosis not present

## 2018-01-09 DIAGNOSIS — J3089 Other allergic rhinitis: Secondary | ICD-10-CM | POA: Diagnosis not present

## 2018-01-09 DIAGNOSIS — J301 Allergic rhinitis due to pollen: Secondary | ICD-10-CM | POA: Diagnosis not present

## 2018-01-09 DIAGNOSIS — Z01419 Encounter for gynecological examination (general) (routine) without abnormal findings: Secondary | ICD-10-CM | POA: Diagnosis not present

## 2018-01-09 DIAGNOSIS — Z6828 Body mass index (BMI) 28.0-28.9, adult: Secondary | ICD-10-CM | POA: Diagnosis not present

## 2018-01-12 DIAGNOSIS — H9203 Otalgia, bilateral: Secondary | ICD-10-CM | POA: Diagnosis not present

## 2018-01-12 DIAGNOSIS — J029 Acute pharyngitis, unspecified: Secondary | ICD-10-CM | POA: Diagnosis not present

## 2018-01-12 DIAGNOSIS — J0140 Acute pansinusitis, unspecified: Secondary | ICD-10-CM | POA: Diagnosis not present

## 2018-01-16 DIAGNOSIS — J3089 Other allergic rhinitis: Secondary | ICD-10-CM | POA: Diagnosis not present

## 2018-01-16 DIAGNOSIS — J3081 Allergic rhinitis due to animal (cat) (dog) hair and dander: Secondary | ICD-10-CM | POA: Diagnosis not present

## 2018-01-16 DIAGNOSIS — J301 Allergic rhinitis due to pollen: Secondary | ICD-10-CM | POA: Diagnosis not present

## 2018-01-20 DIAGNOSIS — J3089 Other allergic rhinitis: Secondary | ICD-10-CM | POA: Diagnosis not present

## 2018-01-20 DIAGNOSIS — J3081 Allergic rhinitis due to animal (cat) (dog) hair and dander: Secondary | ICD-10-CM | POA: Diagnosis not present

## 2018-01-20 DIAGNOSIS — J301 Allergic rhinitis due to pollen: Secondary | ICD-10-CM | POA: Diagnosis not present

## 2018-01-30 DIAGNOSIS — H10433 Chronic follicular conjunctivitis, bilateral: Secondary | ICD-10-CM | POA: Diagnosis not present

## 2018-02-04 DIAGNOSIS — J3089 Other allergic rhinitis: Secondary | ICD-10-CM | POA: Diagnosis not present

## 2018-02-04 DIAGNOSIS — J301 Allergic rhinitis due to pollen: Secondary | ICD-10-CM | POA: Diagnosis not present

## 2018-02-04 DIAGNOSIS — J3081 Allergic rhinitis due to animal (cat) (dog) hair and dander: Secondary | ICD-10-CM | POA: Diagnosis not present

## 2018-02-13 DIAGNOSIS — J301 Allergic rhinitis due to pollen: Secondary | ICD-10-CM | POA: Diagnosis not present

## 2018-02-13 DIAGNOSIS — J3089 Other allergic rhinitis: Secondary | ICD-10-CM | POA: Diagnosis not present

## 2018-02-13 DIAGNOSIS — J3081 Allergic rhinitis due to animal (cat) (dog) hair and dander: Secondary | ICD-10-CM | POA: Diagnosis not present

## 2018-02-16 DIAGNOSIS — H10433 Chronic follicular conjunctivitis, bilateral: Secondary | ICD-10-CM | POA: Diagnosis not present

## 2018-02-20 DIAGNOSIS — J3089 Other allergic rhinitis: Secondary | ICD-10-CM | POA: Diagnosis not present

## 2018-02-20 DIAGNOSIS — J301 Allergic rhinitis due to pollen: Secondary | ICD-10-CM | POA: Diagnosis not present

## 2018-02-20 DIAGNOSIS — J3081 Allergic rhinitis due to animal (cat) (dog) hair and dander: Secondary | ICD-10-CM | POA: Diagnosis not present

## 2018-02-27 DIAGNOSIS — J301 Allergic rhinitis due to pollen: Secondary | ICD-10-CM | POA: Diagnosis not present

## 2018-02-27 DIAGNOSIS — M25532 Pain in left wrist: Secondary | ICD-10-CM | POA: Diagnosis not present

## 2018-02-27 DIAGNOSIS — J3089 Other allergic rhinitis: Secondary | ICD-10-CM | POA: Diagnosis not present

## 2018-02-27 DIAGNOSIS — J3081 Allergic rhinitis due to animal (cat) (dog) hair and dander: Secondary | ICD-10-CM | POA: Diagnosis not present

## 2018-02-27 DIAGNOSIS — M25531 Pain in right wrist: Secondary | ICD-10-CM | POA: Diagnosis not present

## 2018-03-06 ENCOUNTER — Ambulatory Visit: Payer: Federal, State, Local not specified - PPO | Admitting: Podiatry

## 2018-03-06 ENCOUNTER — Encounter

## 2018-03-12 DIAGNOSIS — J4540 Moderate persistent asthma, uncomplicated: Secondary | ICD-10-CM | POA: Diagnosis not present

## 2018-03-12 DIAGNOSIS — Z91013 Allergy to seafood: Secondary | ICD-10-CM | POA: Diagnosis not present

## 2018-03-12 DIAGNOSIS — J3089 Other allergic rhinitis: Secondary | ICD-10-CM | POA: Diagnosis not present

## 2018-03-12 DIAGNOSIS — J301 Allergic rhinitis due to pollen: Secondary | ICD-10-CM | POA: Diagnosis not present

## 2018-03-20 DIAGNOSIS — J3089 Other allergic rhinitis: Secondary | ICD-10-CM | POA: Diagnosis not present

## 2018-03-20 DIAGNOSIS — M810 Age-related osteoporosis without current pathological fracture: Secondary | ICD-10-CM | POA: Diagnosis not present

## 2018-03-20 DIAGNOSIS — Z0000 Encounter for general adult medical examination without abnormal findings: Secondary | ICD-10-CM | POA: Diagnosis not present

## 2018-03-20 DIAGNOSIS — J3081 Allergic rhinitis due to animal (cat) (dog) hair and dander: Secondary | ICD-10-CM | POA: Diagnosis not present

## 2018-03-20 DIAGNOSIS — R82998 Other abnormal findings in urine: Secondary | ICD-10-CM | POA: Diagnosis not present

## 2018-03-20 DIAGNOSIS — J301 Allergic rhinitis due to pollen: Secondary | ICD-10-CM | POA: Diagnosis not present

## 2018-03-20 DIAGNOSIS — E119 Type 2 diabetes mellitus without complications: Secondary | ICD-10-CM | POA: Diagnosis not present

## 2018-03-23 DIAGNOSIS — E7849 Other hyperlipidemia: Secondary | ICD-10-CM | POA: Diagnosis not present

## 2018-03-23 DIAGNOSIS — I129 Hypertensive chronic kidney disease with stage 1 through stage 4 chronic kidney disease, or unspecified chronic kidney disease: Secondary | ICD-10-CM | POA: Diagnosis not present

## 2018-03-23 DIAGNOSIS — Z0000 Encounter for general adult medical examination without abnormal findings: Secondary | ICD-10-CM | POA: Diagnosis not present

## 2018-03-23 DIAGNOSIS — Z23 Encounter for immunization: Secondary | ICD-10-CM | POA: Diagnosis not present

## 2018-03-23 DIAGNOSIS — M12849 Other specific arthropathies, not elsewhere classified, unspecified hand: Secondary | ICD-10-CM | POA: Diagnosis not present

## 2018-03-23 DIAGNOSIS — E119 Type 2 diabetes mellitus without complications: Secondary | ICD-10-CM | POA: Diagnosis not present

## 2018-04-03 DIAGNOSIS — J3089 Other allergic rhinitis: Secondary | ICD-10-CM | POA: Diagnosis not present

## 2018-04-03 DIAGNOSIS — J301 Allergic rhinitis due to pollen: Secondary | ICD-10-CM | POA: Diagnosis not present

## 2018-04-03 DIAGNOSIS — J3081 Allergic rhinitis due to animal (cat) (dog) hair and dander: Secondary | ICD-10-CM | POA: Diagnosis not present

## 2018-04-10 DIAGNOSIS — J301 Allergic rhinitis due to pollen: Secondary | ICD-10-CM | POA: Diagnosis not present

## 2018-04-10 DIAGNOSIS — J3089 Other allergic rhinitis: Secondary | ICD-10-CM | POA: Diagnosis not present

## 2018-04-10 DIAGNOSIS — J3081 Allergic rhinitis due to animal (cat) (dog) hair and dander: Secondary | ICD-10-CM | POA: Diagnosis not present

## 2018-04-15 DIAGNOSIS — J3089 Other allergic rhinitis: Secondary | ICD-10-CM | POA: Diagnosis not present

## 2018-04-15 DIAGNOSIS — J3081 Allergic rhinitis due to animal (cat) (dog) hair and dander: Secondary | ICD-10-CM | POA: Diagnosis not present

## 2018-04-15 DIAGNOSIS — J301 Allergic rhinitis due to pollen: Secondary | ICD-10-CM | POA: Diagnosis not present

## 2018-04-16 DIAGNOSIS — K080 Exfoliation of teeth due to systemic causes: Secondary | ICD-10-CM | POA: Diagnosis not present

## 2018-05-01 DIAGNOSIS — Z91013 Allergy to seafood: Secondary | ICD-10-CM | POA: Diagnosis not present

## 2018-05-01 DIAGNOSIS — J4540 Moderate persistent asthma, uncomplicated: Secondary | ICD-10-CM | POA: Diagnosis not present

## 2018-05-01 DIAGNOSIS — J3089 Other allergic rhinitis: Secondary | ICD-10-CM | POA: Diagnosis not present

## 2018-05-01 DIAGNOSIS — J301 Allergic rhinitis due to pollen: Secondary | ICD-10-CM | POA: Diagnosis not present

## 2018-05-07 DIAGNOSIS — J301 Allergic rhinitis due to pollen: Secondary | ICD-10-CM | POA: Diagnosis not present

## 2018-05-07 DIAGNOSIS — J3081 Allergic rhinitis due to animal (cat) (dog) hair and dander: Secondary | ICD-10-CM | POA: Diagnosis not present

## 2018-05-08 DIAGNOSIS — J3089 Other allergic rhinitis: Secondary | ICD-10-CM | POA: Diagnosis not present

## 2018-05-22 DIAGNOSIS — J3081 Allergic rhinitis due to animal (cat) (dog) hair and dander: Secondary | ICD-10-CM | POA: Diagnosis not present

## 2018-05-22 DIAGNOSIS — J301 Allergic rhinitis due to pollen: Secondary | ICD-10-CM | POA: Diagnosis not present

## 2018-05-22 DIAGNOSIS — J3089 Other allergic rhinitis: Secondary | ICD-10-CM | POA: Diagnosis not present

## 2018-06-03 DIAGNOSIS — J301 Allergic rhinitis due to pollen: Secondary | ICD-10-CM | POA: Diagnosis not present

## 2018-06-03 DIAGNOSIS — J3089 Other allergic rhinitis: Secondary | ICD-10-CM | POA: Diagnosis not present

## 2018-06-03 DIAGNOSIS — J3081 Allergic rhinitis due to animal (cat) (dog) hair and dander: Secondary | ICD-10-CM | POA: Diagnosis not present

## 2018-06-12 DIAGNOSIS — J3081 Allergic rhinitis due to animal (cat) (dog) hair and dander: Secondary | ICD-10-CM | POA: Diagnosis not present

## 2018-06-12 DIAGNOSIS — J3089 Other allergic rhinitis: Secondary | ICD-10-CM | POA: Diagnosis not present

## 2018-06-12 DIAGNOSIS — J301 Allergic rhinitis due to pollen: Secondary | ICD-10-CM | POA: Diagnosis not present

## 2018-06-19 DIAGNOSIS — J301 Allergic rhinitis due to pollen: Secondary | ICD-10-CM | POA: Diagnosis not present

## 2018-06-19 DIAGNOSIS — J3081 Allergic rhinitis due to animal (cat) (dog) hair and dander: Secondary | ICD-10-CM | POA: Diagnosis not present

## 2018-06-19 DIAGNOSIS — J3089 Other allergic rhinitis: Secondary | ICD-10-CM | POA: Diagnosis not present

## 2018-07-06 DIAGNOSIS — J3081 Allergic rhinitis due to animal (cat) (dog) hair and dander: Secondary | ICD-10-CM | POA: Diagnosis not present

## 2018-07-06 DIAGNOSIS — J301 Allergic rhinitis due to pollen: Secondary | ICD-10-CM | POA: Diagnosis not present

## 2018-07-06 DIAGNOSIS — J3089 Other allergic rhinitis: Secondary | ICD-10-CM | POA: Diagnosis not present

## 2018-07-16 DIAGNOSIS — B079 Viral wart, unspecified: Secondary | ICD-10-CM | POA: Insufficient documentation

## 2018-07-16 DIAGNOSIS — B078 Other viral warts: Secondary | ICD-10-CM | POA: Diagnosis not present

## 2018-07-16 DIAGNOSIS — B353 Tinea pedis: Secondary | ICD-10-CM | POA: Insufficient documentation

## 2018-07-16 DIAGNOSIS — L853 Xerosis cutis: Secondary | ICD-10-CM | POA: Diagnosis not present

## 2018-07-16 DIAGNOSIS — L219 Seborrheic dermatitis, unspecified: Secondary | ICD-10-CM | POA: Diagnosis not present

## 2018-07-16 DIAGNOSIS — R202 Paresthesia of skin: Secondary | ICD-10-CM | POA: Insufficient documentation

## 2018-07-23 DIAGNOSIS — J3081 Allergic rhinitis due to animal (cat) (dog) hair and dander: Secondary | ICD-10-CM | POA: Diagnosis not present

## 2018-07-23 DIAGNOSIS — J301 Allergic rhinitis due to pollen: Secondary | ICD-10-CM | POA: Diagnosis not present

## 2018-07-23 DIAGNOSIS — J3089 Other allergic rhinitis: Secondary | ICD-10-CM | POA: Diagnosis not present

## 2018-07-31 DIAGNOSIS — E119 Type 2 diabetes mellitus without complications: Secondary | ICD-10-CM | POA: Diagnosis not present

## 2018-07-31 DIAGNOSIS — E1169 Type 2 diabetes mellitus with other specified complication: Secondary | ICD-10-CM | POA: Diagnosis not present

## 2018-07-31 DIAGNOSIS — J301 Allergic rhinitis due to pollen: Secondary | ICD-10-CM | POA: Diagnosis not present

## 2018-07-31 DIAGNOSIS — Z1331 Encounter for screening for depression: Secondary | ICD-10-CM | POA: Diagnosis not present

## 2018-07-31 DIAGNOSIS — J3089 Other allergic rhinitis: Secondary | ICD-10-CM | POA: Diagnosis not present

## 2018-07-31 DIAGNOSIS — I129 Hypertensive chronic kidney disease with stage 1 through stage 4 chronic kidney disease, or unspecified chronic kidney disease: Secondary | ICD-10-CM | POA: Diagnosis not present

## 2018-07-31 DIAGNOSIS — M12849 Other specific arthropathies, not elsewhere classified, unspecified hand: Secondary | ICD-10-CM | POA: Diagnosis not present

## 2018-07-31 DIAGNOSIS — J3081 Allergic rhinitis due to animal (cat) (dog) hair and dander: Secondary | ICD-10-CM | POA: Diagnosis not present

## 2018-08-24 ENCOUNTER — Other Ambulatory Visit: Payer: Self-pay

## 2018-08-24 ENCOUNTER — Emergency Department (HOSPITAL_BASED_OUTPATIENT_CLINIC_OR_DEPARTMENT_OTHER)
Admission: EM | Admit: 2018-08-24 | Discharge: 2018-08-24 | Disposition: A | Discharge status: Home / Self Care | Payer: Federal, State, Local not specified - PPO | Attending: Family Medicine | Admitting: Family Medicine

## 2018-08-24 ENCOUNTER — Encounter (HOSPITAL_BASED_OUTPATIENT_CLINIC_OR_DEPARTMENT_OTHER): Payer: Self-pay | Admitting: *Deleted

## 2018-08-24 ENCOUNTER — Emergency Department (HOSPITAL_BASED_OUTPATIENT_CLINIC_OR_DEPARTMENT_OTHER): Payer: Federal, State, Local not specified - PPO

## 2018-08-24 DIAGNOSIS — R05 Cough: Secondary | ICD-10-CM | POA: Insufficient documentation

## 2018-08-24 DIAGNOSIS — I10 Essential (primary) hypertension: Secondary | ICD-10-CM | POA: Diagnosis not present

## 2018-08-24 DIAGNOSIS — R059 Cough, unspecified: Secondary | ICD-10-CM

## 2018-08-24 DIAGNOSIS — E119 Type 2 diabetes mellitus without complications: Secondary | ICD-10-CM | POA: Insufficient documentation

## 2018-08-24 DIAGNOSIS — Z79899 Other long term (current) drug therapy: Secondary | ICD-10-CM | POA: Diagnosis not present

## 2018-08-24 DIAGNOSIS — R0602 Shortness of breath: Secondary | ICD-10-CM | POA: Diagnosis not present

## 2018-08-24 MED ORDER — IPRATROPIUM-ALBUTEROL 0.5-2.5 (3) MG/3ML IN SOLN
3.0000 mL | Freq: Four times a day (QID) | RESPIRATORY_TRACT | Status: DC
  Administered 2018-08-24: 3 mL via RESPIRATORY_TRACT
  Filled 2018-08-24: qty 3

## 2018-08-24 MED ORDER — BENZONATATE 100 MG PO CAPS: 100.0000 mg | ORAL_CAPSULE | Freq: Three times a day (TID) | ORAL | 0 refills | Status: DC | PRN

## 2018-08-24 NOTE — ED Notes (Signed)
Saw dr last week and was given steroid  pack and  Antibiotic states she had a cough that would not quit  And hard to breath , states left her rescue inhaler at home

## 2018-08-24 NOTE — ED Provider Notes (Signed)
[facility] EMERGENCY DEPARTMENT Provider Note   CSN: [id number] Arrival date & time: 08/24/18  1237    History   Chief Complaint Chief Complaint  Patient presents with  . Cough    HPI Gloria Brooks is a 63 y.o. female with a history of hypertension, hyperlipidemia, asthma presenting to the emergency department today with chief complaint of cough x 2 weeks.  She reports the cough is nonproductive and is gradually getting worse. The cough is intermittent and is worse with exertion. Patient reports associated shortness of breath.  Patient has been taking Robitussin without relief of cough. Patient states that she was started on antibiotic and steroids for sinus infection x4 days ago by her PCP.  She reports her nasal congestion has improved. However now she feels like she has congestion in her chest.   Denies fever, palpitations, chest pain, nausea, vomiting.      Past Medical History:  Diagnosis Date  . Allergy   . Arthritis   . Diabetes mellitus without complication (HCC)   . Environmental allergies   . Hyperlipidemia   . Hypertension   . Obesity     Patient Active Problem List   Diagnosis Date Noted  . Paresthesia 05/10/2016  . Obesity     Past Surgical History:  Procedure Laterality Date  . ABDOMINAL HYSTERECTOMY    . CESAREAN SECTION    . TUBAL LIGATION       OB History   No obstetric history on file.      Home Medications    Prior to Admission medications   Medication Sig Start Date End Date Taking? Authorizing Provider  amLODipine-atorvastatin (CADUET) 5-20 MG per tablet Take 1 tablet by mouth daily.   Yes [provider]  Budesonide-Formoterol Fumarate (SYMBICORT IN) Inhale into the lungs as needed.    Yes [provider]  metFORMIN (GLUCOPHAGE-XR) 500 MG 24 hr tablet TAKE 1 TABLET BY MOUTH EVERY DAY 05/25/18  Yes [provider]  mometasone (NASONEX) 50 MCG/ACT nasal spray Place 2 sprays into the nose as needed.     Yes [provider]  montelukast (SINGULAIR) 10 MG tablet  09/28/12  Yes [provider]  Semaglutide,0.25 or 0.5MG /DOS, (OZEMPIC, 0.25 OR 0.5 MG/DOSE,) 2 MG/1.5ML SOPN  07/06/18  Yes [provider]  benzonatate (TESSALON) 100 MG capsule Take 1 capsule (100 mg total) by mouth 3 (three) times daily as needed for cough. 08/24/18   ,  E, PA-C  levofloxacin (LEVAQUIN) 500 MG tablet Take 1 tablet (500 mg total) by mouth daily. 09/14/16   Elvina Sidle, MD  penicillin v potassium (VEETID) 500 MG tablet Take 1 tablet (500 mg total) by mouth 4 (four) times daily. 11/30/16   Roxy Horseman, PA-C  sitaGLIPtin-metformin (JANUMET) 50-500 MG tablet Take 1 tablet by mouth 2 (two) times daily with a meal.    [provider]    Family History Family History  Problem Relation Age of Onset  . Hypertension Mother   . Arthritis Mother   . Hyperlipidemia Mother   . Diabetes Father   . Heart disease Father   . Obesity Sister   . Myocarditis Sister   . Breast cancer Maternal Aunt   . Breast cancer Cousin     Social History Social History   Tobacco Use  . Smoking status: Never Smoker  . Smokeless tobacco: Never Used  Substance Use Topics  . Alcohol use: No  . Drug use: No     Allergies  Shellfish allergy; Naproxen; Food; and Mucinex [guaifenesin er]   Review of Systems Review of Systems  Respiratory: Positive for cough and shortness of breath.   All other systems reviewed and are negative.    Physical Exam Updated Vital Signs BP (!) 163/100   Pulse 69   Temp 98 F (36.7 C) (Oral)   Resp 16   Ht 5\' 6"  (1.676 m)   Wt 79.4 kg   SpO2 100%   BMI 28.25 kg/m   Physical Exam Vitals signs and nursing note reviewed.  Constitutional:      Appearance: She is not ill-appearing or toxic-appearing.     Comments: Patient is well-appearing, not under acute distress.  She is speaking in full sentences, without accessory muscle use.  No nasal  flaring or pursed lip breathing.  HENT:     Head: Normocephalic and atraumatic.     Nose: Nose normal.     Mouth/Throat:     Mouth: Mucous membranes are moist.     Pharynx: Oropharynx is clear.  Eyes:     General: No scleral icterus.    Conjunctiva/sclera: Conjunctivae normal.  Neck:     Musculoskeletal: Normal range of motion.  Cardiovascular:     Rate and Rhythm: Normal rate and regular rhythm.     Pulses: Normal pulses.     Heart sounds: Normal heart sounds.  Pulmonary:     Effort: Pulmonary effort is normal.     Comments: Decreased lung sounds throughout. Abdominal:     General: There is no distension.     Palpations: Abdomen is soft.     Tenderness: There is no abdominal tenderness. There is no guarding or rebound.  Musculoskeletal: Normal range of motion.     Right lower leg: No edema.     Left lower leg: No edema.  Skin:    General: Skin is warm and dry.     Capillary Refill: Capillary refill takes less than 2 seconds.  Neurological:     Mental Status: She is alert. Mental status is at baseline.     Motor: No weakness.  Psychiatric:        Behavior: Behavior normal.      ED Treatments / Results  Labs (all labs ordered are listed, but only abnormal results are displayed) Labs Reviewed - No data to display  EKG None  Radiology Dg Chest 2 View  Result Date: 08/24/2018 CLINICAL DATA:  Short of breath cough EXAM: CHEST - 2 VIEW COMPARISON:  07/30/2009 FINDINGS: The heart size and mediastinal contours are within normal limits. Both lungs are clear. The visualized skeletal structures are unremarkable. IMPRESSION: No active cardiopulmonary disease. Electronically Signed   By: Marlan Palau M.D.   On: 08/24/2018 15:33    Procedures Procedures (including critical care time)  Medications Ordered in ED Medications  ipratropium-albuterol (DUONEB) 0.5-2.5 (3) MG/3ML nebulizer solution 3 mL (3 mLs Nebulization Given 08/24/18 1509)     Initial Impression /  Assessment and Plan / ED Course  I have reviewed the triage vital signs and the nursing notes.  Pertinent labs & imaging results that were available during my care of the patient were reviewed by me and considered in my medical decision making (see chart for details).   Pt is afebrile, nontoxic-appearing.  Presents nonproductive cough x2-week.  DDX includes pneumonia, viral URI, asthma exacerbation, less likely influenza. I viewed her CXR and do not see signs of acute infectious processes. Patient ambulated in ED with O2 saturations maintained >90, no  current signs of respiratory distress. Lung exam improved after nebulizer treatment.  Patient is already taking prednisone from her PCP, will not give prescription at discharge and recommend pt continue taking as prescribed. Pt states she is breathing at baseline.  Patient noted to have elevated blood pressure in the emergency department.  No signs of hypertensive urgency.  Discussed with patient the need for close follow-up to have blood pressure rechecked within 1 week. Discussed strict ED return precautions. Pt verbalized understanding of and is in agreement with this plan. Pt stable for discharge home at this time.      Final Clinical Impressions(s) / ED Diagnoses   Final diagnoses:  Cough    ED Discharge Orders         Ordered    benzonatate (TESSALON) 100 MG capsule  3 times daily PRN     08/24/18 1632           Sherene Sires, PA-C 08/24/18 2345    Maia Plan, MD 08/25/18 1018

## 2018-08-24 NOTE — ED Triage Notes (Signed)
Started on antibiotic and a steroid for sinus infection 4 days ago. Cough. Her MD cant see her for a recheck until the afternoon and told her to come here.

## 2018-08-24 NOTE — Discharge Instructions (Addendum)
You have been seen today for cough. Please read and follow all provided instructions. Return to the emergency room for worsening condition or new concerning symptoms.    Your blood pressure was elevated today.  Please have it rechecked by PCP within 2-5 days.  1. Medications:  Prescription sent to pharmacy for [facility]. This is a cough medicine. Please take as directed. Continue usual home medications. Take medications as prescribed. Please review all of the medicines and only take them if you do not have an allergy to them.  2. Treatment: rest, drink plenty of fluids 3. Follow Up: Please follow up with your primary doctor in 2-5 days for discussion of your diagnoses and further evaluation after today's visit; Call today to arrange your follow up.    It is also a possibility that you have an allergic reaction to any of the medicines that you have been prescribed - Everybody reacts differently to medications and while MOST people have no trouble with most medicines, you may have a reaction such as nausea, vomiting, rash, swelling, shortness of breath. If this is the case, please stop taking the medicine immediately and contact your physician.  ?

## 2018-08-28 DIAGNOSIS — M810 Age-related osteoporosis without current pathological fracture: Secondary | ICD-10-CM | POA: Diagnosis not present

## 2018-11-24 ENCOUNTER — Other Ambulatory Visit: Payer: Self-pay | Admitting: Internal Medicine

## 2018-11-24 DIAGNOSIS — Z1231 Encounter for screening mammogram for malignant neoplasm of breast: Secondary | ICD-10-CM

## 2018-11-25 ENCOUNTER — Other Ambulatory Visit: Payer: Self-pay

## 2018-11-25 ENCOUNTER — Ambulatory Visit
Admission: RE | Admit: 2018-11-25 | Discharge: 2018-11-25 | Disposition: A | Discharge status: Home / Self Care | Payer: Federal, State, Local not specified - PPO | Source: Ambulatory Visit | Attending: Internal Medicine | Admitting: Internal Medicine

## 2018-11-25 DIAGNOSIS — Z1231 Encounter for screening mammogram for malignant neoplasm of breast: Secondary | ICD-10-CM | POA: Diagnosis not present

## 2018-12-04 DIAGNOSIS — E669 Obesity, unspecified: Secondary | ICD-10-CM | POA: Diagnosis not present

## 2018-12-04 DIAGNOSIS — N182 Chronic kidney disease, stage 2 (mild): Secondary | ICD-10-CM | POA: Diagnosis not present

## 2018-12-04 DIAGNOSIS — E1169 Type 2 diabetes mellitus with other specified complication: Secondary | ICD-10-CM | POA: Diagnosis not present

## 2018-12-04 DIAGNOSIS — I129 Hypertensive chronic kidney disease with stage 1 through stage 4 chronic kidney disease, or unspecified chronic kidney disease: Secondary | ICD-10-CM | POA: Diagnosis not present

## 2019-02-17 DIAGNOSIS — J3081 Allergic rhinitis due to animal (cat) (dog) hair and dander: Secondary | ICD-10-CM | POA: Diagnosis not present

## 2019-02-17 DIAGNOSIS — J301 Allergic rhinitis due to pollen: Secondary | ICD-10-CM | POA: Diagnosis not present

## 2019-02-17 DIAGNOSIS — J3089 Other allergic rhinitis: Secondary | ICD-10-CM | POA: Diagnosis not present

## 2019-02-19 DIAGNOSIS — Z23 Encounter for immunization: Secondary | ICD-10-CM | POA: Diagnosis not present

## 2019-02-22 DIAGNOSIS — J3081 Allergic rhinitis due to animal (cat) (dog) hair and dander: Secondary | ICD-10-CM | POA: Diagnosis not present

## 2019-02-22 DIAGNOSIS — J4540 Moderate persistent asthma, uncomplicated: Secondary | ICD-10-CM | POA: Diagnosis not present

## 2019-02-22 DIAGNOSIS — J301 Allergic rhinitis due to pollen: Secondary | ICD-10-CM | POA: Diagnosis not present

## 2019-02-22 DIAGNOSIS — Z91013 Allergy to seafood: Secondary | ICD-10-CM | POA: Diagnosis not present

## 2019-02-22 DIAGNOSIS — J3089 Other allergic rhinitis: Secondary | ICD-10-CM | POA: Diagnosis not present

## 2019-03-12 DIAGNOSIS — J3089 Other allergic rhinitis: Secondary | ICD-10-CM | POA: Diagnosis not present

## 2019-03-12 DIAGNOSIS — J301 Allergic rhinitis due to pollen: Secondary | ICD-10-CM | POA: Diagnosis not present

## 2019-03-12 DIAGNOSIS — J3081 Allergic rhinitis due to animal (cat) (dog) hair and dander: Secondary | ICD-10-CM | POA: Diagnosis not present

## 2019-03-19 DIAGNOSIS — J301 Allergic rhinitis due to pollen: Secondary | ICD-10-CM | POA: Diagnosis not present

## 2019-03-19 DIAGNOSIS — J3081 Allergic rhinitis due to animal (cat) (dog) hair and dander: Secondary | ICD-10-CM | POA: Diagnosis not present

## 2019-03-19 DIAGNOSIS — J3089 Other allergic rhinitis: Secondary | ICD-10-CM | POA: Diagnosis not present

## 2019-04-16 DIAGNOSIS — J3089 Other allergic rhinitis: Secondary | ICD-10-CM | POA: Diagnosis not present

## 2019-04-16 DIAGNOSIS — J3081 Allergic rhinitis due to animal (cat) (dog) hair and dander: Secondary | ICD-10-CM | POA: Diagnosis not present

## 2019-04-16 DIAGNOSIS — J301 Allergic rhinitis due to pollen: Secondary | ICD-10-CM | POA: Diagnosis not present

## 2019-04-20 DIAGNOSIS — E1169 Type 2 diabetes mellitus with other specified complication: Secondary | ICD-10-CM | POA: Diagnosis not present

## 2019-04-20 DIAGNOSIS — E7849 Other hyperlipidemia: Secondary | ICD-10-CM | POA: Diagnosis not present

## 2019-04-20 DIAGNOSIS — Z0000 Encounter for general adult medical examination without abnormal findings: Secondary | ICD-10-CM | POA: Diagnosis not present

## 2019-04-21 DIAGNOSIS — J3089 Other allergic rhinitis: Secondary | ICD-10-CM | POA: Diagnosis not present

## 2019-04-21 DIAGNOSIS — J301 Allergic rhinitis due to pollen: Secondary | ICD-10-CM | POA: Diagnosis not present

## 2019-04-21 DIAGNOSIS — J3081 Allergic rhinitis due to animal (cat) (dog) hair and dander: Secondary | ICD-10-CM | POA: Diagnosis not present

## 2019-04-22 DIAGNOSIS — R82998 Other abnormal findings in urine: Secondary | ICD-10-CM | POA: Diagnosis not present

## 2019-04-23 DIAGNOSIS — J301 Allergic rhinitis due to pollen: Secondary | ICD-10-CM | POA: Diagnosis not present

## 2019-04-23 DIAGNOSIS — J3089 Other allergic rhinitis: Secondary | ICD-10-CM | POA: Diagnosis not present

## 2019-04-23 DIAGNOSIS — J3081 Allergic rhinitis due to animal (cat) (dog) hair and dander: Secondary | ICD-10-CM | POA: Diagnosis not present

## 2019-04-27 DIAGNOSIS — Z1212 Encounter for screening for malignant neoplasm of rectum: Secondary | ICD-10-CM | POA: Diagnosis not present

## 2019-04-28 DIAGNOSIS — Z23 Encounter for immunization: Secondary | ICD-10-CM | POA: Diagnosis not present

## 2019-04-28 DIAGNOSIS — E785 Hyperlipidemia, unspecified: Secondary | ICD-10-CM | POA: Diagnosis not present

## 2019-04-28 DIAGNOSIS — I129 Hypertensive chronic kidney disease with stage 1 through stage 4 chronic kidney disease, or unspecified chronic kidney disease: Secondary | ICD-10-CM | POA: Diagnosis not present

## 2019-04-28 DIAGNOSIS — N182 Chronic kidney disease, stage 2 (mild): Secondary | ICD-10-CM | POA: Diagnosis not present

## 2019-04-28 DIAGNOSIS — Z0000 Encounter for general adult medical examination without abnormal findings: Secondary | ICD-10-CM | POA: Diagnosis not present

## 2019-04-28 DIAGNOSIS — E1169 Type 2 diabetes mellitus with other specified complication: Secondary | ICD-10-CM | POA: Diagnosis not present

## 2019-05-03 IMAGING — CR DG CHEST 2V
2 series · 2 of 2 positions shown · non-contrast
Comparison: 07/30/2009

CLINICAL DATA: Short of breath cough

EXAM:
CHEST - 2 VIEW

[w chest pa]
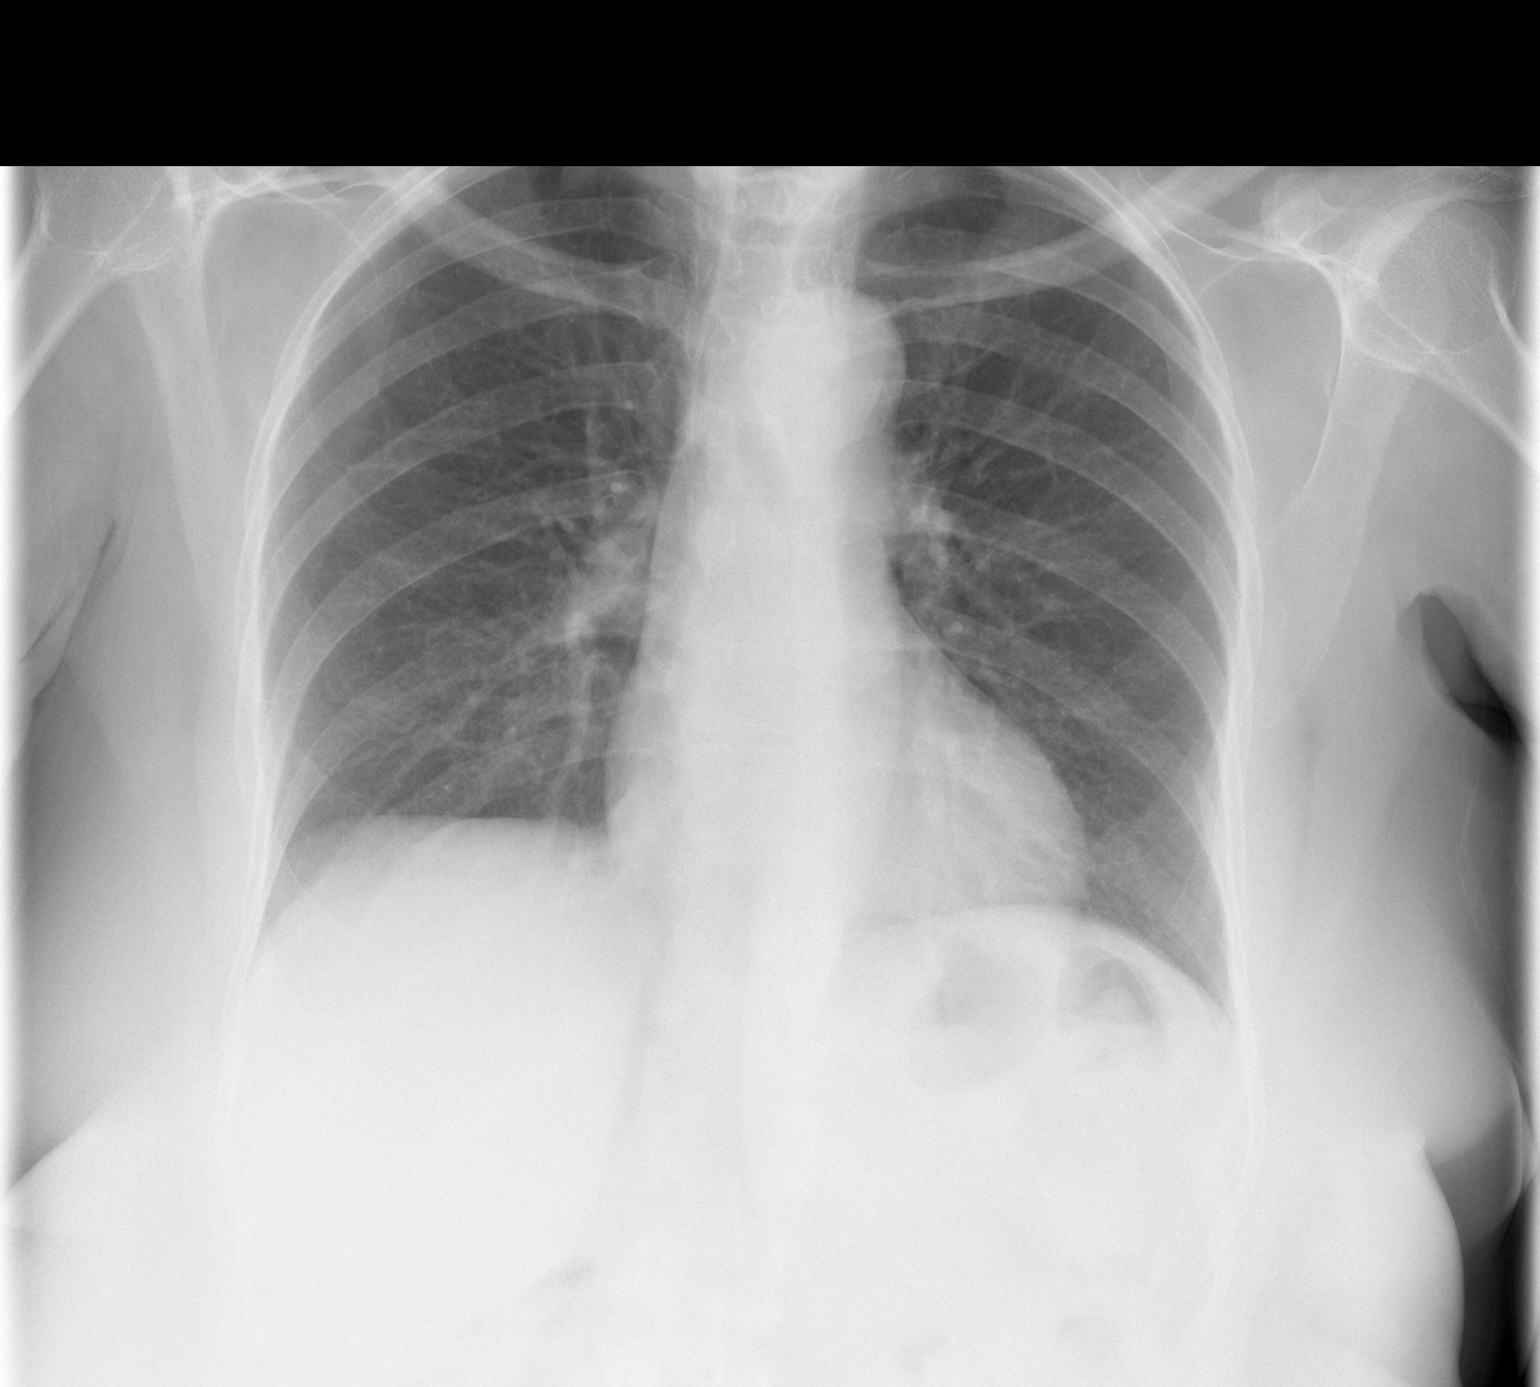

[w chest lat]
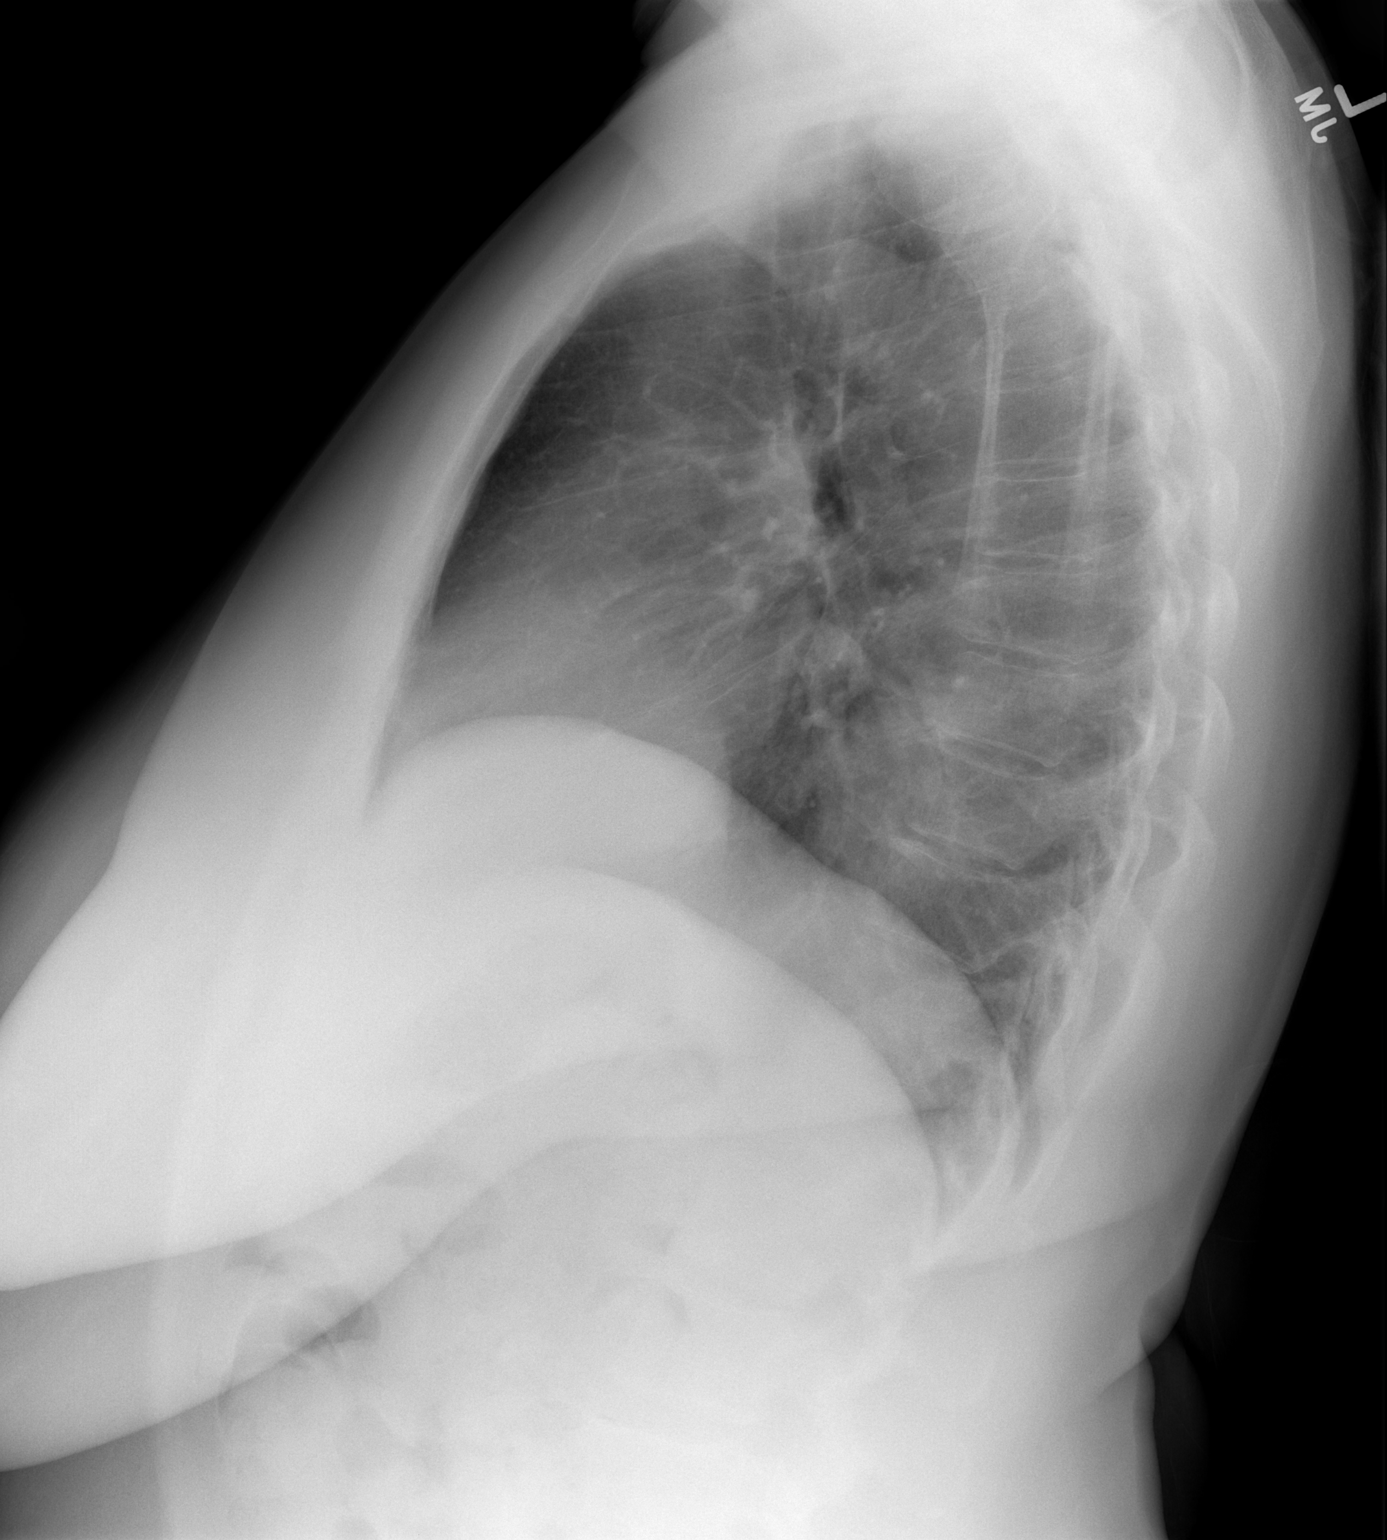

[2 of 2 positions shown; findings below may reference images not displayed]

FINDINGS: The heart size and mediastinal contours are within normal limits.
Both lungs are clear. The visualized skeletal structures are
unremarkable.
IMPRESSION: No active cardiopulmonary disease.

## 2019-05-07 DIAGNOSIS — J301 Allergic rhinitis due to pollen: Secondary | ICD-10-CM | POA: Diagnosis not present

## 2019-05-07 DIAGNOSIS — J3081 Allergic rhinitis due to animal (cat) (dog) hair and dander: Secondary | ICD-10-CM | POA: Diagnosis not present

## 2019-05-07 DIAGNOSIS — J3089 Other allergic rhinitis: Secondary | ICD-10-CM | POA: Diagnosis not present

## 2019-05-10 DIAGNOSIS — J3081 Allergic rhinitis due to animal (cat) (dog) hair and dander: Secondary | ICD-10-CM | POA: Diagnosis not present

## 2019-05-10 DIAGNOSIS — J301 Allergic rhinitis due to pollen: Secondary | ICD-10-CM | POA: Diagnosis not present

## 2019-05-10 DIAGNOSIS — J3089 Other allergic rhinitis: Secondary | ICD-10-CM | POA: Diagnosis not present

## 2019-05-18 DIAGNOSIS — R112 Nausea with vomiting, unspecified: Secondary | ICD-10-CM | POA: Diagnosis not present

## 2019-05-18 DIAGNOSIS — R1084 Generalized abdominal pain: Secondary | ICD-10-CM | POA: Diagnosis not present

## 2019-05-18 DIAGNOSIS — K2970 Gastritis, unspecified, without bleeding: Secondary | ICD-10-CM | POA: Diagnosis not present

## 2019-05-18 DIAGNOSIS — R197 Diarrhea, unspecified: Secondary | ICD-10-CM | POA: Diagnosis not present

## 2019-05-21 DIAGNOSIS — J3089 Other allergic rhinitis: Secondary | ICD-10-CM | POA: Diagnosis not present

## 2019-05-21 DIAGNOSIS — J3081 Allergic rhinitis due to animal (cat) (dog) hair and dander: Secondary | ICD-10-CM | POA: Diagnosis not present

## 2019-05-21 DIAGNOSIS — J301 Allergic rhinitis due to pollen: Secondary | ICD-10-CM | POA: Diagnosis not present

## 2019-05-25 DIAGNOSIS — J3089 Other allergic rhinitis: Secondary | ICD-10-CM | POA: Diagnosis not present

## 2019-05-25 DIAGNOSIS — J301 Allergic rhinitis due to pollen: Secondary | ICD-10-CM | POA: Diagnosis not present

## 2019-05-25 DIAGNOSIS — J3081 Allergic rhinitis due to animal (cat) (dog) hair and dander: Secondary | ICD-10-CM | POA: Diagnosis not present

## 2019-06-11 DIAGNOSIS — J3081 Allergic rhinitis due to animal (cat) (dog) hair and dander: Secondary | ICD-10-CM | POA: Diagnosis not present

## 2019-06-11 DIAGNOSIS — J301 Allergic rhinitis due to pollen: Secondary | ICD-10-CM | POA: Diagnosis not present

## 2019-06-11 DIAGNOSIS — J3089 Other allergic rhinitis: Secondary | ICD-10-CM | POA: Diagnosis not present

## 2019-06-18 DIAGNOSIS — J3081 Allergic rhinitis due to animal (cat) (dog) hair and dander: Secondary | ICD-10-CM | POA: Diagnosis not present

## 2019-06-18 DIAGNOSIS — J301 Allergic rhinitis due to pollen: Secondary | ICD-10-CM | POA: Diagnosis not present

## 2019-06-18 DIAGNOSIS — J3089 Other allergic rhinitis: Secondary | ICD-10-CM | POA: Diagnosis not present

## 2019-06-22 DIAGNOSIS — J3081 Allergic rhinitis due to animal (cat) (dog) hair and dander: Secondary | ICD-10-CM | POA: Diagnosis not present

## 2019-06-22 DIAGNOSIS — J301 Allergic rhinitis due to pollen: Secondary | ICD-10-CM | POA: Diagnosis not present

## 2019-06-22 DIAGNOSIS — J3089 Other allergic rhinitis: Secondary | ICD-10-CM | POA: Diagnosis not present

## 2019-06-30 DIAGNOSIS — J3081 Allergic rhinitis due to animal (cat) (dog) hair and dander: Secondary | ICD-10-CM | POA: Diagnosis not present

## 2019-06-30 DIAGNOSIS — J3089 Other allergic rhinitis: Secondary | ICD-10-CM | POA: Diagnosis not present

## 2019-06-30 DIAGNOSIS — J301 Allergic rhinitis due to pollen: Secondary | ICD-10-CM | POA: Diagnosis not present

## 2019-07-21 DIAGNOSIS — J3089 Other allergic rhinitis: Secondary | ICD-10-CM | POA: Diagnosis not present

## 2019-07-21 DIAGNOSIS — J301 Allergic rhinitis due to pollen: Secondary | ICD-10-CM | POA: Diagnosis not present

## 2019-07-21 DIAGNOSIS — J3081 Allergic rhinitis due to animal (cat) (dog) hair and dander: Secondary | ICD-10-CM | POA: Diagnosis not present

## 2019-07-22 DIAGNOSIS — J301 Allergic rhinitis due to pollen: Secondary | ICD-10-CM | POA: Diagnosis not present

## 2019-07-22 DIAGNOSIS — J3081 Allergic rhinitis due to animal (cat) (dog) hair and dander: Secondary | ICD-10-CM | POA: Diagnosis not present

## 2019-07-23 DIAGNOSIS — J3089 Other allergic rhinitis: Secondary | ICD-10-CM | POA: Diagnosis not present

## 2019-07-28 DIAGNOSIS — J3089 Other allergic rhinitis: Secondary | ICD-10-CM | POA: Diagnosis not present

## 2019-07-28 DIAGNOSIS — J301 Allergic rhinitis due to pollen: Secondary | ICD-10-CM | POA: Diagnosis not present

## 2019-07-28 DIAGNOSIS — J3081 Allergic rhinitis due to animal (cat) (dog) hair and dander: Secondary | ICD-10-CM | POA: Diagnosis not present

## 2019-08-16 DIAGNOSIS — J301 Allergic rhinitis due to pollen: Secondary | ICD-10-CM | POA: Diagnosis not present

## 2019-08-16 DIAGNOSIS — J3081 Allergic rhinitis due to animal (cat) (dog) hair and dander: Secondary | ICD-10-CM | POA: Diagnosis not present

## 2019-08-16 DIAGNOSIS — J3089 Other allergic rhinitis: Secondary | ICD-10-CM | POA: Diagnosis not present

## 2019-08-27 DIAGNOSIS — I129 Hypertensive chronic kidney disease with stage 1 through stage 4 chronic kidney disease, or unspecified chronic kidney disease: Secondary | ICD-10-CM | POA: Diagnosis not present

## 2019-08-27 DIAGNOSIS — E1169 Type 2 diabetes mellitus with other specified complication: Secondary | ICD-10-CM | POA: Diagnosis not present

## 2019-08-27 DIAGNOSIS — E669 Obesity, unspecified: Secondary | ICD-10-CM | POA: Diagnosis not present

## 2019-08-27 DIAGNOSIS — J45909 Unspecified asthma, uncomplicated: Secondary | ICD-10-CM | POA: Diagnosis not present

## 2019-08-27 DIAGNOSIS — Z1331 Encounter for screening for depression: Secondary | ICD-10-CM | POA: Diagnosis not present

## 2019-09-11 ENCOUNTER — Ambulatory Visit: Payer: Federal, State, Local not specified - PPO

## 2019-09-30 ENCOUNTER — Ambulatory Visit: Payer: Federal, State, Local not specified - PPO | Attending: Internal Medicine

## 2019-09-30 DIAGNOSIS — Z23 Encounter for immunization: Secondary | ICD-10-CM

## 2019-09-30 NOTE — Progress Notes (Addendum)
   Covid-19 Vaccination Clinic  Name:  Gloria Brooks    MRN: [id number] DOB: [birth year]  09/30/2019  Ms. Loud was observed post Covid-19 immunization for 30 without incident. She was provided with Vaccine Information Sheet and instruction to access the V-Safe system.   [name] was instructed to call 911 with any severe reactions post vaccine: Marland Kitchen Difficulty breathing  . Swelling of face and throat  . A fast heartbeat  . A bad rash all over body  . Dizziness and weakness   Immunizations Administered    Name Date Dose VIS Date Route   Pfizer COVID-19 Vaccine 09/30/2019  9:20 AM 0.3 mL 06/11/2019 Intramuscular   Manufacturer: [employer], [employer]   Lot: [id number]   NDC: [address]-3

## 2019-10-04 ENCOUNTER — Ambulatory Visit: Payer: Federal, State, Local not specified - PPO

## 2019-10-15 DIAGNOSIS — J301 Allergic rhinitis due to pollen: Secondary | ICD-10-CM | POA: Diagnosis not present

## 2019-10-15 DIAGNOSIS — J3089 Other allergic rhinitis: Secondary | ICD-10-CM | POA: Diagnosis not present

## 2019-10-15 DIAGNOSIS — J3081 Allergic rhinitis due to animal (cat) (dog) hair and dander: Secondary | ICD-10-CM | POA: Diagnosis not present

## 2019-10-20 DIAGNOSIS — J3089 Other allergic rhinitis: Secondary | ICD-10-CM | POA: Diagnosis not present

## 2019-10-20 DIAGNOSIS — J3081 Allergic rhinitis due to animal (cat) (dog) hair and dander: Secondary | ICD-10-CM | POA: Diagnosis not present

## 2019-10-20 DIAGNOSIS — J301 Allergic rhinitis due to pollen: Secondary | ICD-10-CM | POA: Diagnosis not present

## 2019-10-22 ENCOUNTER — Other Ambulatory Visit: Payer: Self-pay | Admitting: Internal Medicine

## 2019-10-22 DIAGNOSIS — J3089 Other allergic rhinitis: Secondary | ICD-10-CM | POA: Diagnosis not present

## 2019-10-22 DIAGNOSIS — J301 Allergic rhinitis due to pollen: Secondary | ICD-10-CM | POA: Diagnosis not present

## 2019-10-22 DIAGNOSIS — J3081 Allergic rhinitis due to animal (cat) (dog) hair and dander: Secondary | ICD-10-CM | POA: Diagnosis not present

## 2019-10-22 DIAGNOSIS — Z1231 Encounter for screening mammogram for malignant neoplasm of breast: Secondary | ICD-10-CM

## 2019-10-29 DIAGNOSIS — J3081 Allergic rhinitis due to animal (cat) (dog) hair and dander: Secondary | ICD-10-CM | POA: Diagnosis not present

## 2019-10-29 DIAGNOSIS — J301 Allergic rhinitis due to pollen: Secondary | ICD-10-CM | POA: Diagnosis not present

## 2019-10-29 DIAGNOSIS — J3089 Other allergic rhinitis: Secondary | ICD-10-CM | POA: Diagnosis not present

## 2019-11-03 DIAGNOSIS — J301 Allergic rhinitis due to pollen: Secondary | ICD-10-CM | POA: Diagnosis not present

## 2019-11-03 DIAGNOSIS — J3081 Allergic rhinitis due to animal (cat) (dog) hair and dander: Secondary | ICD-10-CM | POA: Diagnosis not present

## 2019-11-03 DIAGNOSIS — J3089 Other allergic rhinitis: Secondary | ICD-10-CM | POA: Diagnosis not present

## 2019-11-12 DIAGNOSIS — J3089 Other allergic rhinitis: Secondary | ICD-10-CM | POA: Diagnosis not present

## 2019-11-12 DIAGNOSIS — J301 Allergic rhinitis due to pollen: Secondary | ICD-10-CM | POA: Diagnosis not present

## 2019-11-12 DIAGNOSIS — J3081 Allergic rhinitis due to animal (cat) (dog) hair and dander: Secondary | ICD-10-CM | POA: Diagnosis not present

## 2019-11-16 DIAGNOSIS — J301 Allergic rhinitis due to pollen: Secondary | ICD-10-CM | POA: Diagnosis not present

## 2019-11-16 DIAGNOSIS — J3081 Allergic rhinitis due to animal (cat) (dog) hair and dander: Secondary | ICD-10-CM | POA: Diagnosis not present

## 2019-11-16 DIAGNOSIS — J3089 Other allergic rhinitis: Secondary | ICD-10-CM | POA: Diagnosis not present

## 2019-11-17 DIAGNOSIS — M5416 Radiculopathy, lumbar region: Secondary | ICD-10-CM | POA: Diagnosis not present

## 2019-11-17 DIAGNOSIS — E1169 Type 2 diabetes mellitus with other specified complication: Secondary | ICD-10-CM | POA: Diagnosis not present

## 2019-11-26 DIAGNOSIS — J3081 Allergic rhinitis due to animal (cat) (dog) hair and dander: Secondary | ICD-10-CM | POA: Diagnosis not present

## 2019-11-26 DIAGNOSIS — J3089 Other allergic rhinitis: Secondary | ICD-10-CM | POA: Diagnosis not present

## 2019-11-26 DIAGNOSIS — J301 Allergic rhinitis due to pollen: Secondary | ICD-10-CM | POA: Diagnosis not present

## 2019-11-30 ENCOUNTER — Other Ambulatory Visit: Payer: Self-pay

## 2019-11-30 ENCOUNTER — Ambulatory Visit
Admission: RE | Admit: 2019-11-30 | Discharge: 2019-11-30 | Disposition: A | Discharge status: Home / Self Care | Payer: Federal, State, Local not specified - PPO | Source: Ambulatory Visit | Attending: Internal Medicine | Admitting: Internal Medicine

## 2019-11-30 DIAGNOSIS — J3089 Other allergic rhinitis: Secondary | ICD-10-CM | POA: Diagnosis not present

## 2019-11-30 DIAGNOSIS — Z1231 Encounter for screening mammogram for malignant neoplasm of breast: Secondary | ICD-10-CM | POA: Diagnosis not present

## 2019-11-30 DIAGNOSIS — J301 Allergic rhinitis due to pollen: Secondary | ICD-10-CM | POA: Diagnosis not present

## 2019-11-30 DIAGNOSIS — J3081 Allergic rhinitis due to animal (cat) (dog) hair and dander: Secondary | ICD-10-CM | POA: Diagnosis not present

## 2019-12-10 DIAGNOSIS — J3081 Allergic rhinitis due to animal (cat) (dog) hair and dander: Secondary | ICD-10-CM | POA: Diagnosis not present

## 2019-12-10 DIAGNOSIS — J3089 Other allergic rhinitis: Secondary | ICD-10-CM | POA: Diagnosis not present

## 2019-12-10 DIAGNOSIS — J301 Allergic rhinitis due to pollen: Secondary | ICD-10-CM | POA: Diagnosis not present

## 2019-12-17 DIAGNOSIS — J301 Allergic rhinitis due to pollen: Secondary | ICD-10-CM | POA: Diagnosis not present

## 2019-12-17 DIAGNOSIS — J3089 Other allergic rhinitis: Secondary | ICD-10-CM | POA: Diagnosis not present

## 2019-12-17 DIAGNOSIS — J3081 Allergic rhinitis due to animal (cat) (dog) hair and dander: Secondary | ICD-10-CM | POA: Diagnosis not present

## 2019-12-24 DIAGNOSIS — J3089 Other allergic rhinitis: Secondary | ICD-10-CM | POA: Diagnosis not present

## 2019-12-24 DIAGNOSIS — J301 Allergic rhinitis due to pollen: Secondary | ICD-10-CM | POA: Diagnosis not present

## 2019-12-24 DIAGNOSIS — J3081 Allergic rhinitis due to animal (cat) (dog) hair and dander: Secondary | ICD-10-CM | POA: Diagnosis not present

## 2019-12-28 DIAGNOSIS — J3089 Other allergic rhinitis: Secondary | ICD-10-CM | POA: Diagnosis not present

## 2019-12-28 DIAGNOSIS — J301 Allergic rhinitis due to pollen: Secondary | ICD-10-CM | POA: Diagnosis not present

## 2019-12-28 DIAGNOSIS — J3081 Allergic rhinitis due to animal (cat) (dog) hair and dander: Secondary | ICD-10-CM | POA: Diagnosis not present

## 2020-01-05 DIAGNOSIS — J3089 Other allergic rhinitis: Secondary | ICD-10-CM | POA: Diagnosis not present

## 2020-01-05 DIAGNOSIS — J301 Allergic rhinitis due to pollen: Secondary | ICD-10-CM | POA: Diagnosis not present

## 2020-01-05 DIAGNOSIS — J3081 Allergic rhinitis due to animal (cat) (dog) hair and dander: Secondary | ICD-10-CM | POA: Diagnosis not present

## 2020-01-14 DIAGNOSIS — J3081 Allergic rhinitis due to animal (cat) (dog) hair and dander: Secondary | ICD-10-CM | POA: Diagnosis not present

## 2020-01-14 DIAGNOSIS — J3089 Other allergic rhinitis: Secondary | ICD-10-CM | POA: Diagnosis not present

## 2020-01-14 DIAGNOSIS — J301 Allergic rhinitis due to pollen: Secondary | ICD-10-CM | POA: Diagnosis not present

## 2020-01-17 DIAGNOSIS — E1169 Type 2 diabetes mellitus with other specified complication: Secondary | ICD-10-CM | POA: Diagnosis not present

## 2020-01-17 DIAGNOSIS — Z1331 Encounter for screening for depression: Secondary | ICD-10-CM | POA: Diagnosis not present

## 2020-01-17 DIAGNOSIS — I129 Hypertensive chronic kidney disease with stage 1 through stage 4 chronic kidney disease, or unspecified chronic kidney disease: Secondary | ICD-10-CM | POA: Diagnosis not present

## 2020-01-17 DIAGNOSIS — J3089 Other allergic rhinitis: Secondary | ICD-10-CM | POA: Diagnosis not present

## 2020-01-17 DIAGNOSIS — E669 Obesity, unspecified: Secondary | ICD-10-CM | POA: Diagnosis not present

## 2020-01-17 DIAGNOSIS — J3081 Allergic rhinitis due to animal (cat) (dog) hair and dander: Secondary | ICD-10-CM | POA: Diagnosis not present

## 2020-01-17 DIAGNOSIS — M5416 Radiculopathy, lumbar region: Secondary | ICD-10-CM | POA: Diagnosis not present

## 2020-01-17 DIAGNOSIS — J301 Allergic rhinitis due to pollen: Secondary | ICD-10-CM | POA: Diagnosis not present

## 2020-01-25 ENCOUNTER — Ambulatory Visit: Payer: Federal, State, Local not specified - PPO | Admitting: Sports Medicine

## 2020-01-25 ENCOUNTER — Encounter: Payer: Self-pay | Admitting: Sports Medicine

## 2020-01-25 ENCOUNTER — Other Ambulatory Visit: Payer: Self-pay

## 2020-01-25 ENCOUNTER — Other Ambulatory Visit: Payer: Self-pay | Admitting: Sports Medicine

## 2020-01-25 ENCOUNTER — Ambulatory Visit (INDEPENDENT_AMBULATORY_CARE_PROVIDER_SITE_OTHER): Payer: Federal, State, Local not specified - PPO

## 2020-01-25 DIAGNOSIS — M722 Plantar fascial fibromatosis: Secondary | ICD-10-CM

## 2020-01-25 DIAGNOSIS — M779 Enthesopathy, unspecified: Secondary | ICD-10-CM

## 2020-01-25 DIAGNOSIS — J3089 Other allergic rhinitis: Secondary | ICD-10-CM | POA: Diagnosis not present

## 2020-01-25 DIAGNOSIS — R252 Cramp and spasm: Secondary | ICD-10-CM | POA: Diagnosis not present

## 2020-01-25 DIAGNOSIS — M79671 Pain in right foot: Secondary | ICD-10-CM | POA: Diagnosis not present

## 2020-01-25 DIAGNOSIS — J3081 Allergic rhinitis due to animal (cat) (dog) hair and dander: Secondary | ICD-10-CM | POA: Diagnosis not present

## 2020-01-25 DIAGNOSIS — J301 Allergic rhinitis due to pollen: Secondary | ICD-10-CM | POA: Diagnosis not present

## 2020-01-25 MED ORDER — PREDNISONE 10 MG (21) PO TBPK: ORAL_TABLET | ORAL | 0 refills | Status: DC

## 2020-01-25 NOTE — Progress Notes (Signed)
Subjective: Gloria Brooks is a 64 y.o. female patient who presents to office for evaluation of right foot pain. Patient complains of progressive pain especially over the last several years reports that the pain was coming and going well over the last few months has flared back up feels like it has become a problem since she got her Covid vaccination.  Patient reports that pain is at the top and the medial side of the foot as well as a little bit at the heel but worse at the top and side of the foot with some cramping.  Patient reports that pain is worse when she attempts to do a lot of walking or standing  Patient Active Problem List   Diagnosis Date Noted   Paresthesia 05/10/2016   Obesity     Current Outpatient Medications on File Prior to Visit  Medication Sig Dispense Refill   amLODipine-atorvastatin (CADUET) 5-20 MG per tablet Take 1 tablet by mouth daily.     benzonatate (TESSALON) 100 MG capsule Take 1 capsule (100 mg total) by mouth 3 (three) times daily as needed for cough. 21 capsule 0   Budesonide-Formoterol Fumarate (SYMBICORT IN) Inhale into the lungs as needed.      levofloxacin (LEVAQUIN) 500 MG tablet Take 1 tablet (500 mg total) by mouth daily. 7 tablet 0   metFORMIN (GLUCOPHAGE-XR) 500 MG 24 hr tablet TAKE 1 TABLET BY MOUTH EVERY DAY     mometasone (NASONEX) 50 MCG/ACT nasal spray Place 2 sprays into the nose as needed.      montelukast (SINGULAIR) 10 MG tablet      penicillin v potassium (VEETID) 500 MG tablet Take 1 tablet (500 mg total) by mouth 4 (four) times daily. 40 tablet 0   Semaglutide,0.25 or 0.5MG /DOS, (OZEMPIC, 0.25 OR 0.5 MG/DOSE,) 2 MG/1.5ML SOPN      sitaGLIPtin-metformin (JANUMET) 50-500 MG tablet Take 1 tablet by mouth 2 (two) times daily with a meal.     Current Facility-Administered Medications on File Prior to Visit  Medication Dose Route Frequency Provider Last Rate Last Admin   triamcinolone acetonide (KENALOG) 10 MG/ML injection 10 mg   10 mg Other Once Asencion Islam, DPM        Allergies  Allergen Reactions   Shellfish Allergy Anaphylaxis   Naproxen Itching and Other (See Comments)    Associated with other pain meds as well   Food     Shrimp   Mucinex [Guaifenesin Er] Swelling    Pt reports throat swelling   Other Swelling    Pt reports throat swelling    Objective:  General: Alert and oriented x3 in no acute distress  Dermatology: No open lesions bilateral lower extremities, no webspace macerations, no ecchymosis bilateral, all nails x 10 are well manicured, dry skin.  Vascular: Dorsalis Pedis and Posterior Tibial pedal pulses palpable, Capillary Fill Time 3 seconds,(+) pedal hair growth bilateral, no edema bilateral lower extremities, Temperature gradient within normal limits.  Neurology: Michaell Cowing sensation intact via light touch bilateral.  Musculoskeletal: Mild tenderness with palpation at dorsal right foot with mild increased pain to the lateral and medial aspects of the foot along the extensor tendon courses as well as mild pain to palpation of the insertion of the plantar fascia.  Pes planus foot type.  Limited range of motion at the ankle supportive of equinus deformity as well as subjective pain that radiates up the legs with some cramping bilateral right greater than left.  Gait: Antalgic gait  Xrays  Right foot   Impression: No acute osseous findings  Assessment and Plan: Problem List Items Addressed This Visit    None    Visit Diagnoses    Foot pain, right    -  Primary   Relevant Orders   Uric acid   Sedimentation rate   Rheumatoid factor   C-reactive protein   ANA   Plantar fasciitis       Tendonitis       Cramping of feet           -Complete examination performed -Xrays reviewed -Discussed treatment options -Rx prednisone to take for multiple tender points of pain -Dispensed cam boot and advised patient to wear consistently over the next few weeks to see if this will be  helpful for her pain and symptoms on the right and to help with taking pressure off the joints which can help with cramping; advised patient to refrain from driving with cam boot -Advise rest ice elevation -Ordered arthritic panel for further evaluation and advised patient when she returns office next visit if negative we will proceed with getting MRI since symptoms are chronic off and on since over 3 years ago -Patient to return to office as scheduled or sooner if condition worsens.  Asencion Islam, DPM

## 2020-01-26 ENCOUNTER — Telehealth: Payer: Self-pay | Admitting: *Deleted

## 2020-01-26 ENCOUNTER — Encounter: Payer: Self-pay | Admitting: Sports Medicine

## 2020-01-26 NOTE — Telephone Encounter (Signed)
Patient called and stated that she needed a note stating that she can wear the air fracture walker all day long at work and I faxed the request per Dr Marylene Land to [employer] at [phone]. Misty Stanley

## 2020-01-29 LAB — RHEUMATOID FACTOR: Rhuematoid fact SerPl-aCnc: <14 IU/mL (ref <14)

## 2020-01-29 LAB — C-REACTIVE PROTEIN: CRP: 4 mg/L (ref <8.0)

## 2020-01-29 LAB — ANTI-NUCLEAR AB-TITER (ANA TITER): ANA Titer 1: 1:40 {titer} — ABNORMAL HIGH

## 2020-01-29 LAB — ANA: Anti Nuclear Antibody (ANA): POSITIVE — AB

## 2020-01-29 LAB — SEDIMENTATION RATE: Sed Rate: 22 mm/h (ref 0–30)

## 2020-01-29 LAB — URIC ACID: Uric Acid, Serum: 5 mg/dL (ref 2.5–7.0)

## 2020-02-10 ENCOUNTER — Other Ambulatory Visit: Payer: Self-pay

## 2020-02-10 ENCOUNTER — Ambulatory Visit: Payer: Federal, State, Local not specified - PPO | Admitting: Sports Medicine

## 2020-02-10 ENCOUNTER — Encounter: Payer: Self-pay | Admitting: Sports Medicine

## 2020-02-10 DIAGNOSIS — R252 Cramp and spasm: Secondary | ICD-10-CM

## 2020-02-10 DIAGNOSIS — G8929 Other chronic pain: Secondary | ICD-10-CM

## 2020-02-10 DIAGNOSIS — M79671 Pain in right foot: Secondary | ICD-10-CM

## 2020-02-10 DIAGNOSIS — M329 Systemic lupus erythematosus, unspecified: Secondary | ICD-10-CM

## 2020-02-10 DIAGNOSIS — M2550 Pain in unspecified joint: Secondary | ICD-10-CM

## 2020-02-10 DIAGNOSIS — M779 Enthesopathy, unspecified: Secondary | ICD-10-CM

## 2020-02-10 DIAGNOSIS — M722 Plantar fascial fibromatosis: Secondary | ICD-10-CM | POA: Diagnosis not present

## 2020-02-10 NOTE — Progress Notes (Signed)
Subjective: Gloria Brooks is a 64 y.o. female patient who returns to office for follow up evaluation of right foot pain and review of blood work. Patient reports that the CAM boot helped but still has pain especially at her joints even her hand and wrist hurts. Patient reports that she has been in pain so long and felt like no one believed her. No other issues noted.   Patient Active Problem List   Diagnosis Date Noted   Paresthesia 05/10/2016   Obesity     Current Outpatient Medications on File Prior to Visit  Medication Sig Dispense Refill   amLODipine-atorvastatin (CADUET) 5-20 MG per tablet Take 1 tablet by mouth daily.     benzonatate (TESSALON) 100 MG capsule Take 1 capsule (100 mg total) by mouth 3 (three) times daily as needed for cough. 21 capsule 0   Budesonide-Formoterol Fumarate (SYMBICORT IN) Inhale into the lungs as needed.      metFORMIN (GLUCOPHAGE-XR) 500 MG 24 hr tablet TAKE 1 TABLET BY MOUTH EVERY DAY     mometasone (NASONEX) 50 MCG/ACT nasal spray Place 2 sprays into the nose as needed.      montelukast (SINGULAIR) 10 MG tablet      penicillin v potassium (VEETID) 500 MG tablet Take 1 tablet (500 mg total) by mouth 4 (four) times daily. 40 tablet 0   Semaglutide,0.25 or 0.5MG /DOS, (OZEMPIC, 0.25 OR 0.5 MG/DOSE,) 2 MG/1.5ML SOPN      sitaGLIPtin-metformin (JANUMET) 50-500 MG tablet Take 1 tablet by mouth 2 (two) times daily with a meal.     levofloxacin (LEVAQUIN) 500 MG tablet Take 1 tablet (500 mg total) by mouth daily. 7 tablet 0   predniSONE (STERAPRED UNI-PAK 21 TAB) 10 MG (21) TBPK tablet Take as directed 21 tablet 0   Current Facility-Administered Medications on File Prior to Visit  Medication Dose Route Frequency Provider Last Rate Last Admin   triamcinolone acetonide (KENALOG) 10 MG/ML injection 10 mg  10 mg Other Once Asencion Islam, DPM        Allergies  Allergen Reactions   Shellfish Allergy Anaphylaxis   Naproxen Itching and Other (See  Comments)    Associated with other pain meds as well   Food     Shrimp   Mucinex [Guaifenesin Er] Swelling    Pt reports throat swelling   Other Swelling    Pt reports throat swelling    Objective:  General: Alert and oriented x3 in no acute distress  Dermatology: No open lesions bilateral lower extremities, no webspace macerations, no ecchymosis bilateral, all nails x 10 are well manicured, dry skin.  Vascular: Dorsalis Pedis and Posterior Tibial pedal pulses palpable, Capillary Fill Time 3 seconds,(+) pedal hair growth bilateral, no edema bilateral lower extremities, Temperature gradient within normal limits.  Neurology: Michaell Cowing sensation intact via light touch bilateral.  Musculoskeletal:Decreased tenderness with palpation at dorsal right foot and extensor tendons and plantar fascia insertion on right.  Pes planus foot type.  Limited range of motion at the ankle supportive of equinus deformity. Resolved cramping.   Assessment and Plan: Problem List Items Addressed This Visit    None    Visit Diagnoses    SLE (systemic lupus erythematosus related syndrome) (HCC)    -  Primary   Relevant Orders   Ambulatory referral to Rheumatology   Foot pain, right       Plantar fasciitis       Tendonitis       Cramping of feet  Chronic pain of multiple joints           -Complete examination performed -Arthritic panel results reviewed; + ANA with speckling  -Advised patient with her history of multiple tender points and chrocnic pain in joints will refer to Rheumatology for another opinion and further evaluation -Advised patient meanwhile to continue with rest, ice, elevation, gentle stretching and good supportive shoes if pain worsens may use CAM boot -Patient to return to office after rheumatology opinion or sooner if condition worsens.  Asencion Islam, DPM

## 2020-02-23 DIAGNOSIS — J301 Allergic rhinitis due to pollen: Secondary | ICD-10-CM | POA: Diagnosis not present

## 2020-02-23 DIAGNOSIS — J3089 Other allergic rhinitis: Secondary | ICD-10-CM | POA: Diagnosis not present

## 2020-02-23 DIAGNOSIS — J3081 Allergic rhinitis due to animal (cat) (dog) hair and dander: Secondary | ICD-10-CM | POA: Diagnosis not present

## 2020-03-07 DIAGNOSIS — J301 Allergic rhinitis due to pollen: Secondary | ICD-10-CM | POA: Diagnosis not present

## 2020-03-07 DIAGNOSIS — J3089 Other allergic rhinitis: Secondary | ICD-10-CM | POA: Diagnosis not present

## 2020-03-07 DIAGNOSIS — J3081 Allergic rhinitis due to animal (cat) (dog) hair and dander: Secondary | ICD-10-CM | POA: Diagnosis not present

## 2020-03-08 ENCOUNTER — Other Ambulatory Visit: Payer: Self-pay

## 2020-03-08 ENCOUNTER — Other Ambulatory Visit: Payer: Federal, State, Local not specified - PPO

## 2020-03-08 ENCOUNTER — Other Ambulatory Visit: Payer: Self-pay | Admitting: *Deleted

## 2020-03-08 DIAGNOSIS — Z20822 Contact with and (suspected) exposure to covid-19: Secondary | ICD-10-CM | POA: Diagnosis not present

## 2020-03-10 LAB — NOVEL CORONAVIRUS, NAA: SARS-CoV-2, NAA: NOT DETECTED

## 2020-03-10 LAB — SARS-COV-2, NAA 2 DAY TAT

## 2020-03-10 LAB — SPECIMEN STATUS REPORT

## 2020-03-31 DIAGNOSIS — J301 Allergic rhinitis due to pollen: Secondary | ICD-10-CM | POA: Diagnosis not present

## 2020-03-31 DIAGNOSIS — J4540 Moderate persistent asthma, uncomplicated: Secondary | ICD-10-CM | POA: Diagnosis not present

## 2020-03-31 DIAGNOSIS — Z91013 Allergy to seafood: Secondary | ICD-10-CM | POA: Diagnosis not present

## 2020-03-31 DIAGNOSIS — J3089 Other allergic rhinitis: Secondary | ICD-10-CM | POA: Diagnosis not present

## 2020-04-10 DIAGNOSIS — J3081 Allergic rhinitis due to animal (cat) (dog) hair and dander: Secondary | ICD-10-CM | POA: Diagnosis not present

## 2020-04-10 DIAGNOSIS — J301 Allergic rhinitis due to pollen: Secondary | ICD-10-CM | POA: Diagnosis not present

## 2020-04-10 DIAGNOSIS — J3089 Other allergic rhinitis: Secondary | ICD-10-CM | POA: Diagnosis not present

## 2020-05-02 DIAGNOSIS — E785 Hyperlipidemia, unspecified: Secondary | ICD-10-CM | POA: Diagnosis not present

## 2020-05-02 DIAGNOSIS — E1169 Type 2 diabetes mellitus with other specified complication: Secondary | ICD-10-CM | POA: Diagnosis not present

## 2020-05-02 DIAGNOSIS — E559 Vitamin D deficiency, unspecified: Secondary | ICD-10-CM | POA: Diagnosis not present

## 2020-05-09 DIAGNOSIS — I129 Hypertensive chronic kidney disease with stage 1 through stage 4 chronic kidney disease, or unspecified chronic kidney disease: Secondary | ICD-10-CM | POA: Diagnosis not present

## 2020-05-09 DIAGNOSIS — Z23 Encounter for immunization: Secondary | ICD-10-CM | POA: Diagnosis not present

## 2020-05-09 DIAGNOSIS — Z0000 Encounter for general adult medical examination without abnormal findings: Secondary | ICD-10-CM | POA: Diagnosis not present

## 2020-05-09 DIAGNOSIS — R82998 Other abnormal findings in urine: Secondary | ICD-10-CM | POA: Diagnosis not present

## 2020-05-09 DIAGNOSIS — E1169 Type 2 diabetes mellitus with other specified complication: Secondary | ICD-10-CM | POA: Diagnosis not present

## 2020-07-11 NOTE — Progress Notes (Deleted)
Office Visit Note  Patient: Gloria Brooks             Date of Birth: 1956/05/11           MRN: [id number]             PCP: Prince Solian, MD Referring: Landis Martins, DPM Visit Date: 07/25/2020 Occupation: '@GUAROCC' @  Subjective:  No chief complaint on file.   History of Present Illness: Gloria Brooks is a 65 y.o. female ***   Activities of Daily Living:  Patient reports morning stiffness for *** {minute/hour:19697}.   Patient {ACTIONS;DENIES/REPORTS:21021675::"Denies"} nocturnal pain.  Difficulty dressing/grooming: {ACTIONS;DENIES/REPORTS:21021675::"Denies"} Difficulty climbing stairs: {ACTIONS;DENIES/REPORTS:21021675::"Denies"} Difficulty getting out of chair: {ACTIONS;DENIES/REPORTS:21021675::"Denies"} Difficulty using hands for taps, buttons, cutlery, and/or writing: {ACTIONS;DENIES/REPORTS:21021675::"Denies"}  No Rheumatology ROS completed.   PMFS History:  Patient Active Problem List   Diagnosis Date Noted  . Paresthesia 05/10/2016  . Obesity     Past Medical History:  Diagnosis Date  . Allergy   . Arthritis   . Diabetes mellitus without complication ([place])   . Environmental allergies   . Hyperlipidemia   . Hypertension   . Obesity     Family History  Problem Relation Age of Onset  . Hypertension Mother   . Arthritis Mother   . Hyperlipidemia Mother   . Diabetes Father   . Heart disease Father   . Obesity Sister   . Myocarditis Sister   . Breast cancer Maternal Aunt   . Breast cancer Cousin    Past Surgical History:  Procedure Laterality Date  . ABDOMINAL HYSTERECTOMY    . CESAREAN SECTION    . TUBAL LIGATION     Social History   Social History Narrative  . Not on file   Immunization History  Administered Date(s) Administered  . PFIZER SARS-COV-2 Vaccination 09/30/2019     Objective: Vital Signs: There were no vitals taken for this visit.   Physical Exam   Musculoskeletal Exam: ***  CDAI Exam: CDAI Score: -- Patient Global:  --; Provider Global: -- Swollen: --; Tender: -- Joint Exam 07/25/2020   No joint exam has been documented for this visit   There is currently no information documented on the homunculus. Go to the Rheumatology activity and complete the homunculus joint exam.  Investigation: No additional findings.  Imaging: No results found.  Recent Labs: Lab Results  Component Value Date   WBC 8.9 01/24/2012   HGB 13.3 01/30/2013   PLT 321 07/29/2009   NA 142 01/30/2013   K 3.6 01/30/2013   CL 105 01/30/2013   CO2 26 07/29/2009   GLUCOSE 149 (H) 01/30/2013   BUN 9 01/30/2013   CREATININE 0.90 01/30/2013   BILITOT 0.7 08/02/2008   ALKPHOS 77 08/02/2008   AST 14 08/02/2008   ALT 14 08/02/2008   PROT 7.4 08/02/2008   ALBUMIN 3.3 (L) 08/02/2008   CALCIUM 8.9 07/29/2009   GFRAA  07/29/2009    >60        The eGFR has been calculated using the MDRD equation. This calculation has not been validated in all clinical situations. eGFR's persistently <60 mL/min signify possible Chronic Kidney Disease.    Speciality Comments: No specialty comments available.  Procedures:  No procedures performed Allergies: Shellfish allergy, Naproxen, Food, Mucinex [guaifenesin er], and Other   Assessment / Plan:     Visit Diagnoses: SLE (systemic lupus erythematosus related syndrome) ([place]) - 01/25/20: ANA 1:40NS, CRP 4.0, RF<14, ESR 22, uric acid 5.0  Paresthesia  Orders: No  orders of the defined types were placed in this encounter.  No orders of the defined types were placed in this encounter.   Face-to-face time spent with patient was *** minutes. Greater than 50% of time was spent in counseling and coordination of care.  Follow-Up Instructions: No follow-ups on file.   Ofilia Neas, PA-C  Note - This record has been created using Dragon software.  Chart creation errors have been sought, but may not always  have been located. Such creation errors do not reflect on  the standard of  medical care.

## 2020-07-25 ENCOUNTER — Ambulatory Visit: Payer: Federal, State, Local not specified - PPO | Admitting: Rheumatology

## 2020-07-25 DIAGNOSIS — M329 Systemic lupus erythematosus, unspecified: Secondary | ICD-10-CM

## 2020-07-25 DIAGNOSIS — R202 Paresthesia of skin: Secondary | ICD-10-CM

## 2020-08-08 IMAGING — MG DIGITAL SCREENING BILAT W/ TOMO W/ CAD
8 series · 8 of 24 positions shown · non-contrast
Comparison: Previous exam(s).

CLINICAL DATA: Screening.

EXAM:
DIGITAL SCREENING BILATERAL MAMMOGRAM WITH TOMO AND CAD

[L MLO synth-2D]
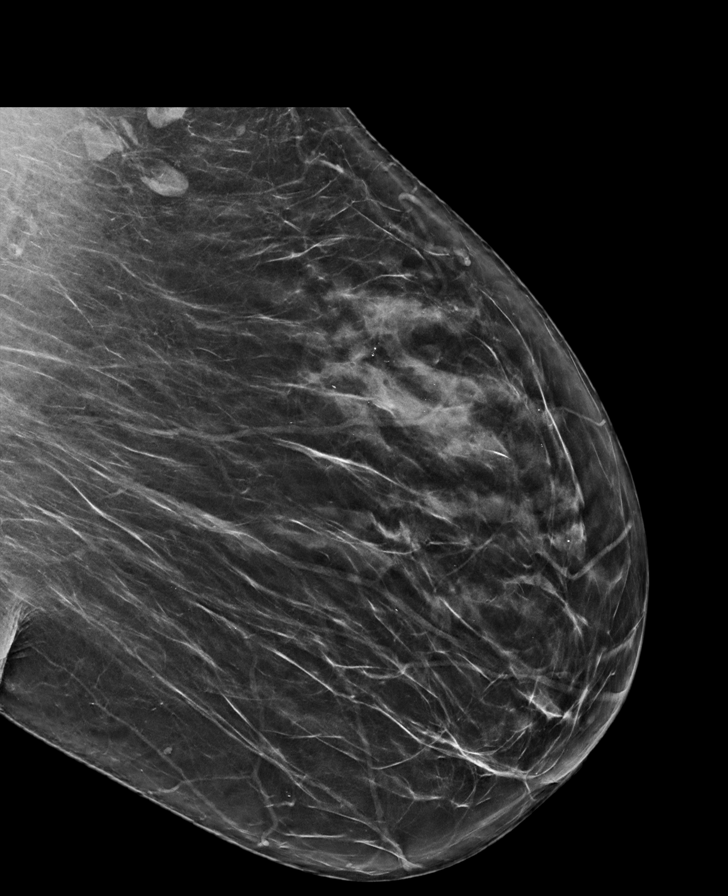

[R CC synth-2D]
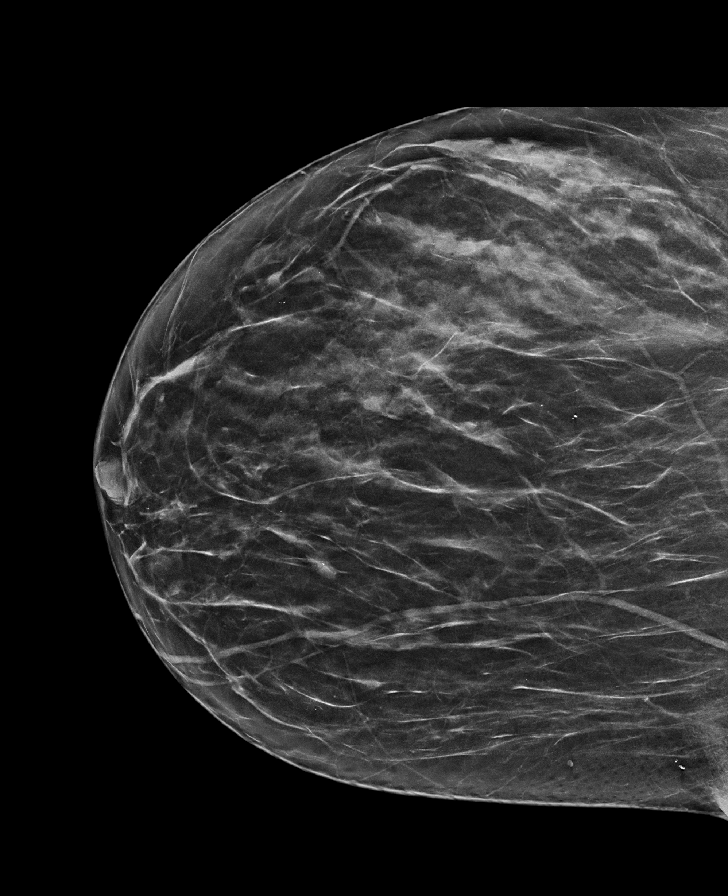

[L CC synth-2D]
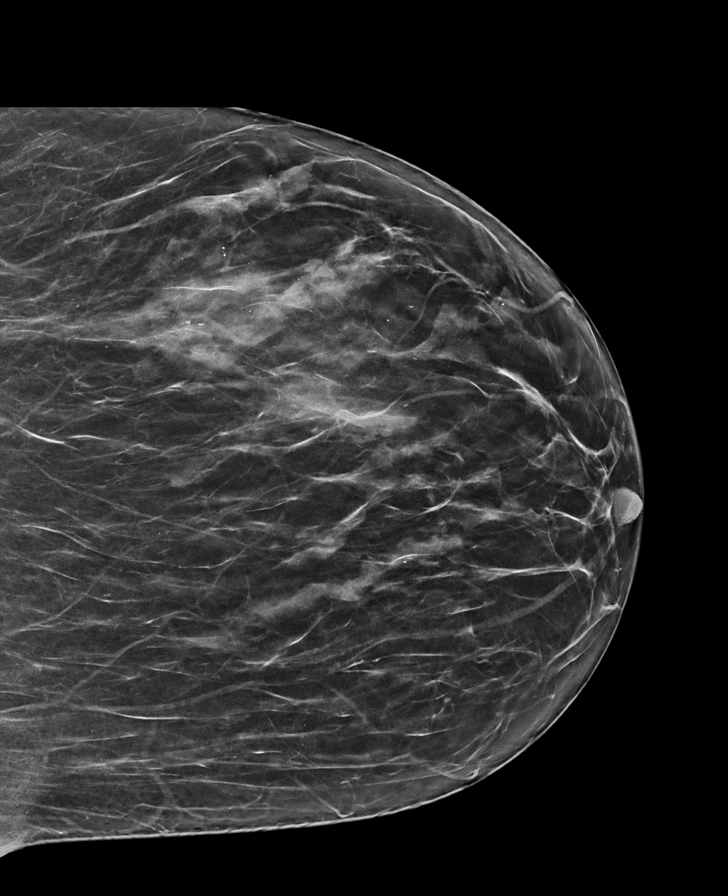

[R MLO synth-2D]
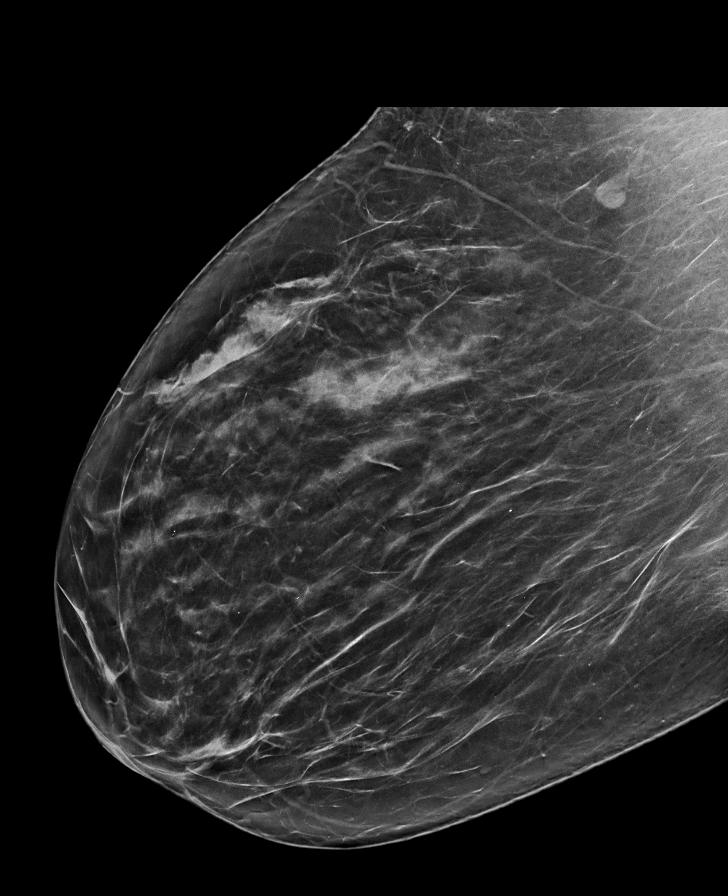

[L CC tomo · tomo slice 32/63.0]
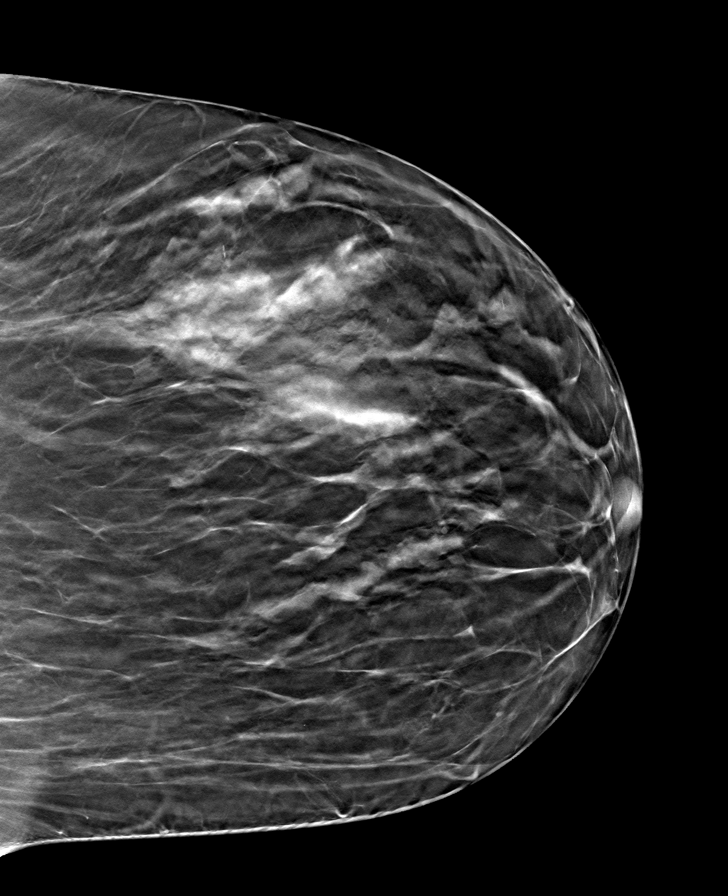

[R MLO tomo · tomo slice 39/76.0]
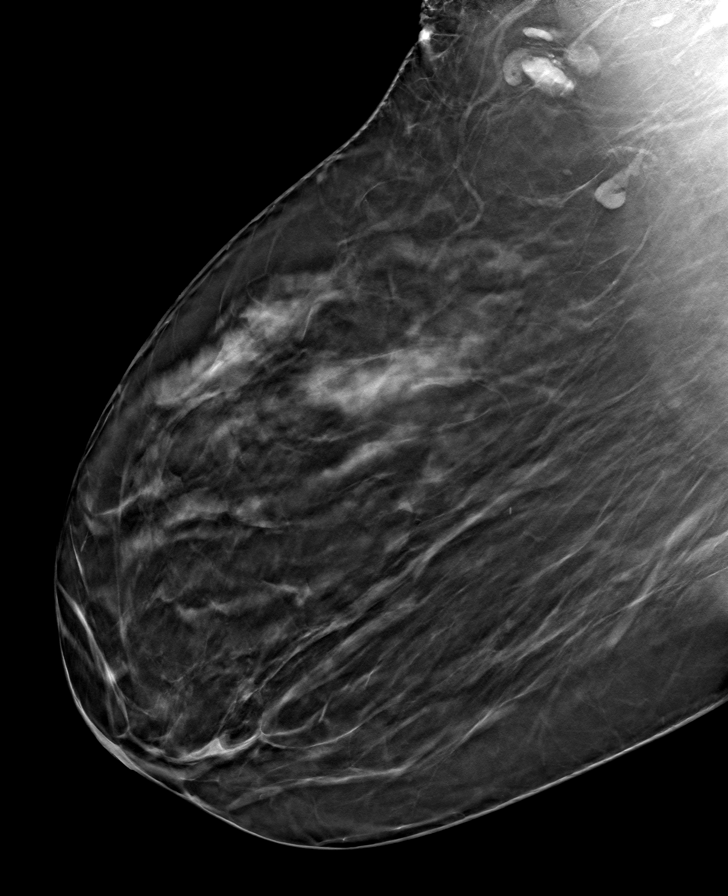

[L MLO tomo · tomo slice 37/74.0]
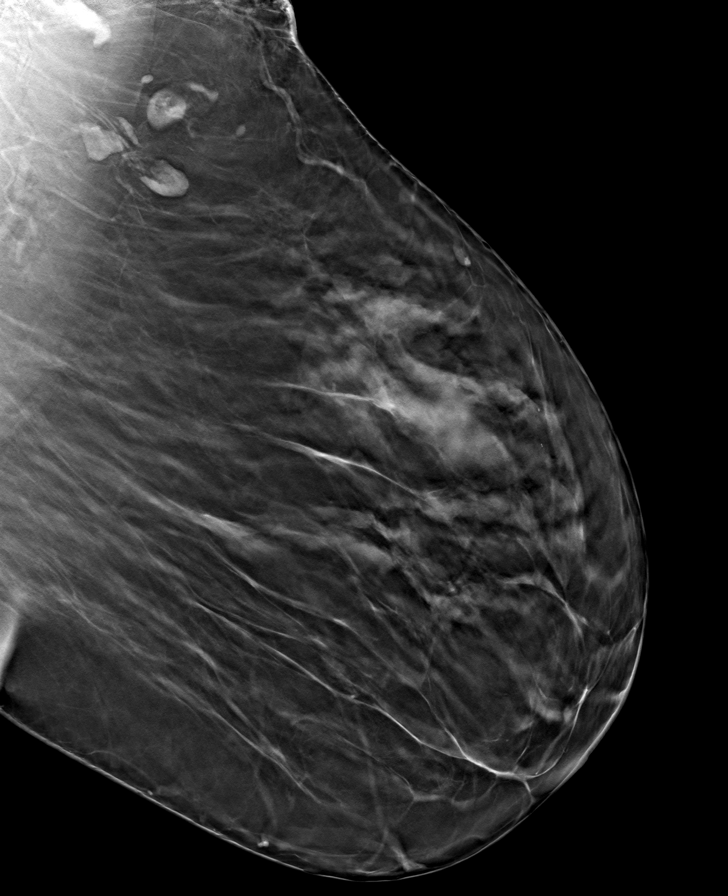

[R CC tomo · tomo slice 33/64.0]
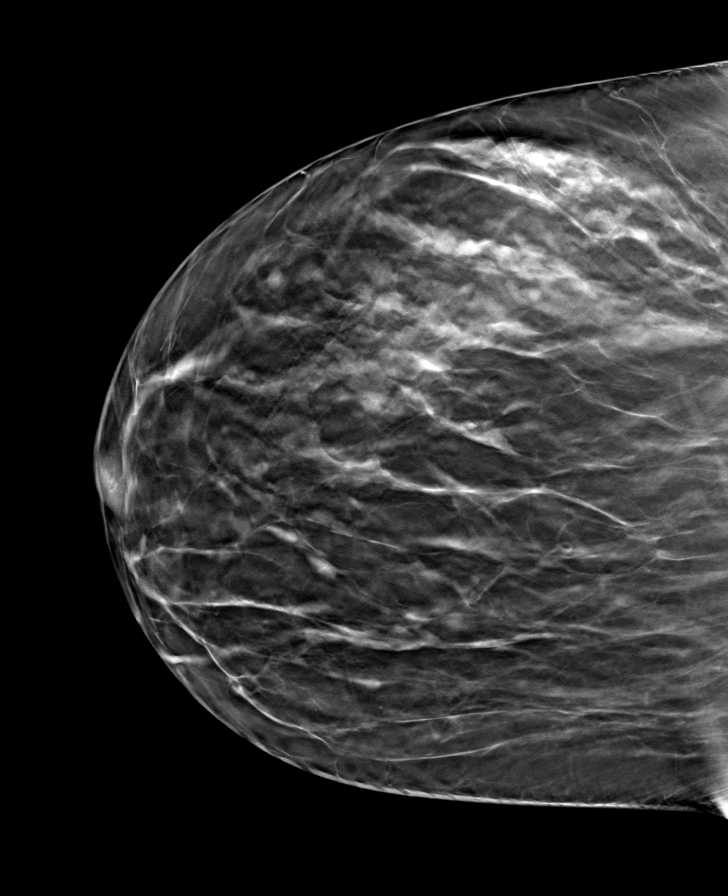

[8 of 24 positions shown; findings below may reference images not displayed]

ACR Breast Density Category c: The breast tissue is heterogeneously
dense, which may obscure small masses.
FINDINGS: There are no findings suspicious for malignancy. Images were
processed with CAD.
IMPRESSION: No mammographic evidence of malignancy. A result letter of this
screening mammogram will be mailed directly to the patient.

RECOMMENDATION:
Screening mammogram in one year. (Code:[id number])

BI-RADS CATEGORY  1: Negative.

## 2020-08-16 ENCOUNTER — Ambulatory Visit: Payer: Federal, State, Local not specified - PPO | Admitting: Rheumatology

## 2020-08-23 DIAGNOSIS — K080 Exfoliation of teeth due to systemic causes: Secondary | ICD-10-CM | POA: Diagnosis not present

## 2020-09-06 DIAGNOSIS — L910 Hypertrophic scar: Secondary | ICD-10-CM | POA: Diagnosis not present

## 2020-09-06 DIAGNOSIS — T148XXA Other injury of unspecified body region, initial encounter: Secondary | ICD-10-CM | POA: Diagnosis not present

## 2020-09-06 DIAGNOSIS — L678 Other hair color and hair shaft abnormalities: Secondary | ICD-10-CM | POA: Insufficient documentation

## 2020-09-06 DIAGNOSIS — L218 Other seborrheic dermatitis: Secondary | ICD-10-CM | POA: Diagnosis not present

## 2020-09-06 DIAGNOSIS — L853 Xerosis cutis: Secondary | ICD-10-CM | POA: Diagnosis not present

## 2020-09-06 DIAGNOSIS — L821 Other seborrheic keratosis: Secondary | ICD-10-CM | POA: Diagnosis not present

## 2020-09-11 NOTE — Progress Notes (Signed)
Office Visit Note  Patient: Gloria Brooks             Date of Birth: [birth year]           MRN: [id number]             PCP: Prince Solian, MD Referring: Landis Martins, DPM Visit Date: 09/22/2020 Occupation: '@GUAROCC' @  Subjective:  Pain in legs.   History of Present Illness: Gloria Brooks is a 65 y.o. female seen in the  consultation per request of Dr. Cannon Kettle.  According the patient her symptoms started about 3 years ago with achiness in her bilateral lower extremities more prominent on the right side.  She states she will wake up with a lot of stiffness and discomfort in her legs.  To the point she will have difficulty walking.  She states the pain would radiate from her legs all the way up to her body.  The symptoms would last about for 3 weeks and then resolved.  She states the symptoms would recur after few months again.  By the time she would get an appointment with the doctor the symptoms will resolve.  The last episode was more than 6 months ago.  She states that she also has cramps in her feet.  She was diagnosed with plantar fasciitis by Dr. Cannon Kettle.  She has had a right heel injection.  The plantars fasciitis symptoms are better currently.  But the muscle cramps persist.  She states her right hand and right wrist has been hurting for the last few years which she relates to working on the computer.  She has been under a lot of stress as her mother had a stroke in September 2021.  Her mother is gradually recovering.  She also has anxiety and insomnia with pandemic other issues going on. She is gravida 2, para 2 miscarriages 0.  Activities of Daily Living:  Patient reports morning stiffness for 5 minutes.   Patient Reports nocturnal pain.  Difficulty dressing/grooming: Denies Difficulty climbing stairs: Denies Difficulty getting out of chair: Denies Difficulty using hands for taps, buttons, cutlery, and/or writing: Denies  Review of Systems  Constitutional: Positive for  fatigue. Negative for night sweats, weight gain and weight loss.  HENT: Negative for mouth sores, trouble swallowing, trouble swallowing, mouth dryness and nose dryness.   Eyes: Positive for dryness. Negative for pain, redness, itching and visual disturbance.  Respiratory: Negative for cough, shortness of breath and difficulty breathing.   Cardiovascular: Negative for chest pain, palpitations, hypertension, irregular heartbeat and swelling in legs/feet.  Gastrointestinal: Negative for blood in stool, constipation and diarrhea.  Endocrine: Negative for increased urination.  Genitourinary: Negative for difficulty urinating and vaginal dryness.  Musculoskeletal: Positive for arthralgias, joint pain and morning stiffness. Negative for joint swelling, myalgias, muscle weakness, muscle tenderness and myalgias.  Skin: Negative for color change, rash, hair loss, redness, skin tightness, ulcers and sensitivity to sunlight.  Allergic/Immunologic: Negative for susceptible to infections.  Neurological: Positive for headaches. Negative for dizziness, numbness, memory loss, night sweats and weakness.       Sinus  Hematological: Negative for bruising/bleeding tendency and swollen glands.  Psychiatric/Behavioral: Positive for sleep disturbance. Negative for depressed mood and confusion. The patient is nervous/anxious.     PMFS History:  Patient Active Problem List   Diagnosis Date Noted  . Paresthesia 05/10/2016  . Obesity     Past Medical History:  Diagnosis Date  . Allergy   . Arthritis   . Diabetes mellitus  without complication ([place])   . Environmental allergies   . Hyperlipidemia   . Hypertension   . Obesity     Family History  Problem Relation Age of Onset  . Hypertension Mother   . Arthritis Mother   . Hyperlipidemia Mother   . Stroke Mother   . Diabetes Father   . Heart disease Father   . Obesity Sister   . Diabetes Sister   . Diabetes Sister   . Healthy Son   . Healthy Son   .  Breast cancer Maternal Aunt   . Breast cancer Cousin    Past Surgical History:  Procedure Laterality Date  . ABDOMINAL HYSTERECTOMY    . CESAREAN SECTION    . TUBAL LIGATION     Social History   Social History Narrative  . Not on file   Immunization History  Administered Date(s) Administered  . PFIZER(Purple Top)SARS-COV-2 Vaccination 09/09/2019, 09/30/2019, 03/31/2020     Objective: Vital Signs: BP 112/79 (BP Location: Right Arm, Patient Position: Sitting, Cuff Size: Normal)   Pulse 84   Resp 15   Ht 5' 5.5" (1.664 m)   Wt 163 lb (73.9 kg)   BMI 26.71 kg/m    Physical Exam Vitals and nursing note reviewed.  Constitutional:      Appearance: She is well-developed.  HENT:     Head: Normocephalic and atraumatic.  Eyes:     Conjunctiva/sclera: Conjunctivae normal.  Cardiovascular:     Rate and Rhythm: Normal rate and regular rhythm.     Heart sounds: Normal heart sounds.  Pulmonary:     Effort: Pulmonary effort is normal.     Breath sounds: Normal breath sounds.  Abdominal:     General: Bowel sounds are normal.     Palpations: Abdomen is soft.  Musculoskeletal:     Cervical back: Normal range of motion.  Lymphadenopathy:     Cervical: No cervical adenopathy.  Skin:    General: Skin is warm and dry.     Capillary Refill: Capillary refill takes less than 2 seconds.  Neurological:     Mental Status: She is alert and oriented to person, place, and time.  Psychiatric:        Behavior: Behavior normal.      Musculoskeletal Exam: C-spine, thoracic and lumbar spine were in good range of motion.  Shoulder joints, elbow joints, wrist joints, MCPs PIPs and DIPs with good range of motion.  No synovitis was noted.  She has prominence of PIP and DIP joints in bilateral hands.  Hip joints, knee joints, ankles, MTPs PIPs and DIPs with good range of motion with no synovitis.  She had no difficulty walking on tiptoes or heels.  She had pes planus.  No muscular weakness or  tenderness was noted.  CDAI Exam: CDAI Score: - Patient Global: -; Provider Global: - Swollen: -; Tender: - Joint Exam 09/22/2020   No joint exam has been documented for this visit   There is currently no information documented on the homunculus. Go to the Rheumatology activity and complete the homunculus joint exam.  Investigation: No additional findings.  Imaging: No results found.  Recent Labs: Lab Results  Component Value Date   WBC 8.9 01/24/2012   HGB 13.3 01/30/2013   PLT 321 07/29/2009   NA 142 01/30/2013   K 3.6 01/30/2013   CL 105 01/30/2013   CO2 26 07/29/2009   GLUCOSE 149 (H) 01/30/2013   BUN 9 01/30/2013   CREATININE 0.90 01/30/2013  BILITOT 0.7 08/02/2008   ALKPHOS 77 08/02/2008   AST 14 08/02/2008   ALT 14 08/02/2008   PROT 7.4 08/02/2008   ALBUMIN 3.3 (L) 08/02/2008   CALCIUM 8.9 07/29/2009   GFRAA  07/29/2009    >60        The eGFR has been calculated using the MDRD equation. This calculation has not been validated in all clinical situations. eGFR's persistently <60 mL/min signify possible Chronic Kidney Disease.    Speciality Comments: No specialty comments available.  Procedures:  No procedures performed Allergies: Shellfish allergy, Naproxen, Food, Latex, Mucinex [guaifenesin er], and Other   Assessment / Plan:     Visit Diagnoses: Positive ANA (antinuclear antibody) - 01/25/20: ANA 1:40NS, CRP 4.0, RF<14, ESR 22, uric acid 5.0 -ANA is low titer and not significant.  Patient denies any history of oral ulcers, nasal ulcers.  She gives history of dry eyes.  There is no history of malar rash, photosensitivity, Raynaud's phenomenon or lymphadenopathy.  Although she is quite concerned that she may have underlying autoimmune disease.  I will obtain following labs.  Plan: Anti-DNA antibody, double-stranded, Sjogrens syndrome-A extractable nuclear antibody, Anti-Smith antibody, RNP Antibody  Pain in right hand -she complains of pain and  discomfort in her right hand and right wrist.  She is on computer for several hours.  I do not see any synovitis.  PIP and DIP prominence was noted.  I will obtain x-ray of her right hand and also anti-CCP antibody.  Her rheumatoid factor was negative.  Plan: Cyclic citrul peptide antibody, IgG, XR Hand 2 View Right.  X-rays were consistent with early osteoarthritic changes.  Myalgia -she gives history of episodic increased generalized pain mostly starting in her lower extremities.  It can last for a few weeks and then recurs after a few months.  Her last episode was 6 months ago.  I believe she may have a component of myofascial pain syndrome or fibromyalgia syndrome.  She has been under a lot of stress as her mother had a stroke in September.  She also has history of anxiety and insomnia.  Need for regular exercise and good sleep hygiene was discussed.  I will also refer her to integrative therapies.  Plan: CK, VITAMIN D 25 Hydroxy (Vit-D Deficiency, Fractures)  Other fatigue -she gives history of fatigue from insomnia.  I will obtain following labs today.  Plan: CBC with Differential/Platelet, COMPLETE METABOLIC PANEL WITH GFR, TSH, VITAMIN D 25 Hydroxy (Vit-D Deficiency, Fractures)  Other insomnia-she has chronic insomnia and gives history of anxiety.  She has been taking melatonin at bedtime which is not effective.  She also takes Benadryl sometimes.  I have advised her to discuss other options with Dr. Dagmar Hait.  She will also benefit from integrative therapies.  Plantar fasciitis-patient had been under care of Dr. Cannon Kettle.  She has had injection to her right foot.  She states that currently her plantar fasciitis is not active.  Environmental allergies-she has sinus allergies.  She gets headaches from her allergies.  Orders: Orders Placed This Encounter  Procedures  . XR Hand 2 View Right  . CBC with Differential/Platelet  . COMPLETE METABOLIC PANEL WITH GFR  . CK  . TSH  . Cyclic citrul peptide  antibody, IgG  . VITAMIN D 25 Hydroxy (Vit-D Deficiency, Fractures)  . Anti-DNA antibody, double-stranded  . Sjogrens syndrome-A extractable nuclear antibody  . Anti-Smith antibody  . RNP Antibody  . Ambulatory referral to Physical Therapy   No orders of  the defined types were placed in this encounter.    Follow-Up Instructions: Return for Positive ANA, myalgias.   Bo Merino, MD  Note - This record has been created using Editor, commissioning.  Chart creation errors have been sought, but may not always  have been located. Such creation errors do not reflect on  the standard of medical care.

## 2020-09-22 ENCOUNTER — Ambulatory Visit: Payer: Federal, State, Local not specified - PPO | Admitting: Rheumatology

## 2020-09-22 ENCOUNTER — Other Ambulatory Visit: Payer: Self-pay

## 2020-09-22 ENCOUNTER — Ambulatory Visit: Payer: Self-pay

## 2020-09-22 ENCOUNTER — Encounter: Payer: Self-pay | Admitting: Rheumatology

## 2020-09-22 VITALS — BP 112/79 | HR 84 | Resp 15 | Ht 65.5 in | Wt 163.0 lb

## 2020-09-22 DIAGNOSIS — R252 Cramp and spasm: Secondary | ICD-10-CM

## 2020-09-22 DIAGNOSIS — R5383 Other fatigue: Secondary | ICD-10-CM | POA: Diagnosis not present

## 2020-09-22 DIAGNOSIS — M79641 Pain in right hand: Secondary | ICD-10-CM

## 2020-09-22 DIAGNOSIS — R768 Other specified abnormal immunological findings in serum: Secondary | ICD-10-CM

## 2020-09-22 DIAGNOSIS — R7689 Other specified abnormal immunological findings in serum: Secondary | ICD-10-CM

## 2020-09-22 DIAGNOSIS — G4709 Other insomnia: Secondary | ICD-10-CM

## 2020-09-22 DIAGNOSIS — M791 Myalgia, unspecified site: Secondary | ICD-10-CM

## 2020-09-22 DIAGNOSIS — R202 Paresthesia of skin: Secondary | ICD-10-CM

## 2020-09-22 DIAGNOSIS — M2550 Pain in unspecified joint: Secondary | ICD-10-CM

## 2020-09-22 DIAGNOSIS — M722 Plantar fascial fibromatosis: Secondary | ICD-10-CM

## 2020-09-22 DIAGNOSIS — Z9109 Other allergy status, other than to drugs and biological substances: Secondary | ICD-10-CM

## 2020-09-22 DIAGNOSIS — M329 Systemic lupus erythematosus, unspecified: Secondary | ICD-10-CM

## 2020-09-25 DIAGNOSIS — E1169 Type 2 diabetes mellitus with other specified complication: Secondary | ICD-10-CM | POA: Diagnosis not present

## 2020-09-25 DIAGNOSIS — I129 Hypertensive chronic kidney disease with stage 1 through stage 4 chronic kidney disease, or unspecified chronic kidney disease: Secondary | ICD-10-CM | POA: Diagnosis not present

## 2020-09-25 LAB — COMPLETE METABOLIC PANEL WITH GFR
AG Ratio: 1.4 (calc) (ref 1.0–2.5)
ALT: 18 U/L (ref 6–29)
AST: 16 U/L (ref 10–35)
Albumin: 4.4 g/dL (ref 3.6–5.1)
Alkaline phosphatase (APISO): 85 U/L (ref 37–153)
BUN: 12 mg/dL (ref 7–25)
CO2: 28 mmol/L (ref 20–32)
Calcium: 9.6 mg/dL (ref 8.6–10.4)
Chloride: 105 mmol/L (ref 98–110)
Creat: 0.92 mg/dL (ref 0.50–0.99)
GFR, Est African American: 77 mL/min/{1.73_m2} (ref >=60)
GFR, Est Non African American: 66 mL/min/{1.73_m2} (ref >=60)
Globulin: 3.2 g/dL (calc) (ref 1.9–3.7)
Glucose, Bld: 85 mg/dL (ref 65–99)
Potassium: 4.2 mmol/L (ref 3.5–5.3)
Sodium: 142 mmol/L (ref 135–146)
Total Bilirubin: 0.6 mg/dL (ref 0.2–1.2)
Total Protein: 7.6 g/dL (ref 6.1–8.1)

## 2020-09-25 LAB — CBC WITH DIFFERENTIAL/PLATELET
Absolute Monocytes: 354 cells/uL (ref 200–950)
Basophils Absolute: 78 cells/uL (ref 0–200)
Basophils Relative: 1.3 %
Eosinophils Absolute: 120 cells/uL (ref 15–500)
Eosinophils Relative: 2 %
HCT: 42.3 % (ref 35.0–45.0)
Hemoglobin: 13.5 g/dL (ref 11.7–15.5)
Lymphs Abs: 1488 cells/uL (ref 850–3900)
MCH: 27.3 pg (ref 27.0–33.0)
MCHC: 31.9 g/dL — ABNORMAL LOW (ref 32.0–36.0)
MCV: 85.5 fL (ref 80.0–100.0)
MPV: 10.4 fL (ref 7.5–12.5)
Monocytes Relative: 5.9 %
Neutro Abs: 3960 cells/uL (ref 1500–7800)
Neutrophils Relative %: 66 %
Platelets: 381 10*3/uL (ref 140–400)
RBC: 4.95 10*6/uL (ref 3.80–5.10)
RDW: 13 % (ref 11.0–15.0)
Total Lymphocyte: 24.8 %
WBC: 6 10*3/uL (ref 3.8–10.8)

## 2020-09-25 LAB — VITAMIN D 25 HYDROXY (VIT D DEFICIENCY, FRACTURES): Vit D, 25-Hydroxy: 80 ng/mL (ref 30–100)

## 2020-09-25 LAB — CK: Total CK: 103 U/L (ref 29–143)

## 2020-09-25 LAB — ANTI-DNA ANTIBODY, DOUBLE-STRANDED: ds DNA Ab: <1 IU/mL

## 2020-09-25 LAB — SJOGRENS SYNDROME-A EXTRACTABLE NUCLEAR ANTIBODY: SSA (Ro) (ENA) Antibody, IgG: <1 AI

## 2020-09-25 LAB — RNP ANTIBODY: Ribonucleic Protein(ENA) Antibody, IgG: <1 AI

## 2020-09-25 LAB — ANTI-SMITH ANTIBODY: ENA SM Ab Ser-aCnc: <1 AI

## 2020-09-25 LAB — TSH: TSH: 0.74 mIU/L (ref 0.40–4.50)

## 2020-09-25 LAB — CYCLIC CITRUL PEPTIDE ANTIBODY, IGG: Cyclic Citrullin Peptide Ab: <16 UNITS

## 2020-09-25 NOTE — Progress Notes (Signed)
All the labs are within normal limits.  I will discuss results at the follow-up visit.

## 2020-10-06 ENCOUNTER — Ambulatory Visit: Payer: Federal, State, Local not specified - PPO | Attending: Internal Medicine

## 2020-10-06 ENCOUNTER — Other Ambulatory Visit: Payer: Self-pay

## 2020-10-06 DIAGNOSIS — Z23 Encounter for immunization: Secondary | ICD-10-CM

## 2020-10-06 NOTE — Progress Notes (Signed)
   Covid-19 Vaccination Clinic  Name:  Gloria Brooks    MRN: [id number] DOB: Mar 17, 1956  10/06/2020  [name] was observed post Covid-19 immunization for 15 minutes without incident. She was provided with Vaccine Information Sheet and instruction to access the V-Safe system.   [name] was instructed to call 911 with any severe reactions post vaccine: Marland Kitchen Difficulty breathing  . Swelling of face and throat  . A fast heartbeat  . A bad rash all over body  . Dizziness and weakness   Immunizations Administered    Name Date Dose VIS Date Route   PFIZER Comrnaty(Gray TOP) Covid-19 Vaccine 10/06/2020  9:58 AM 0.3 mL 06/08/2020 Intramuscular   Manufacturer: [employer], [employer]   Lot: [id number]   NDC: [phone]

## 2020-10-11 DIAGNOSIS — M791 Myalgia, unspecified site: Secondary | ICD-10-CM | POA: Diagnosis not present

## 2020-10-13 ENCOUNTER — Other Ambulatory Visit (HOSPITAL_BASED_OUTPATIENT_CLINIC_OR_DEPARTMENT_OTHER): Payer: Self-pay

## 2020-10-13 MED ORDER — COVID-19 MRNA VACCINE (PFIZER) 30 MCG/0.3ML IM SUSP
INTRAMUSCULAR | 0 refills | Status: DC
  Filled 2020-10-13: qty 0.3, 1d supply, fill #0

## 2020-10-22 DIAGNOSIS — J301 Allergic rhinitis due to pollen: Secondary | ICD-10-CM | POA: Diagnosis not present

## 2020-10-22 NOTE — Progress Notes (Deleted)
Office Visit Note  Patient: Gloria Brooks             Date of Birth: [birth year]           MRN: [id number]             PCP: Prince Solian, MD Referring: Prince Solian, MD Visit Date: 10/27/2020 Occupation: '@GUAROCC' @  Subjective:  No chief complaint on file.   History of Present Illness: Gloria Brooks is a 65 y.o. female ***   Activities of Daily Living:  Patient reports morning stiffness for *** {minute/hour:19697}.   Patient {ACTIONS;DENIES/REPORTS:21021675::"Denies"} nocturnal pain.  Difficulty dressing/grooming: {ACTIONS;DENIES/REPORTS:21021675::"Denies"} Difficulty climbing stairs: {ACTIONS;DENIES/REPORTS:21021675::"Denies"} Difficulty getting out of chair: {ACTIONS;DENIES/REPORTS:21021675::"Denies"} Difficulty using hands for taps, buttons, cutlery, and/or writing: {ACTIONS;DENIES/REPORTS:21021675::"Denies"}  No Rheumatology ROS completed.   PMFS History:  Patient Active Problem List   Diagnosis Date Noted  . Paresthesia 05/10/2016  . Obesity     Past Medical History:  Diagnosis Date  . Allergy   . Arthritis   . Diabetes mellitus without complication ([place])   . Environmental allergies   . Hyperlipidemia   . Hypertension   . Obesity     Family History  Problem Relation Age of Onset  . Hypertension Mother   . Arthritis Mother   . Hyperlipidemia Mother   . Stroke Mother   . Diabetes Father   . Heart disease Father   . Obesity Sister   . Diabetes Sister   . Diabetes Sister   . Healthy Son   . Healthy Son   . Breast cancer Maternal Aunt   . Breast cancer Cousin    Past Surgical History:  Procedure Laterality Date  . ABDOMINAL HYSTERECTOMY    . CESAREAN SECTION    . TUBAL LIGATION     Social History   Social History Narrative  . Not on file   Immunization History  Administered Date(s) Administered  . PFIZER Comirnaty(Gray Top)Covid-19 Tri-Sucrose Vaccine 10/06/2020  . PFIZER(Purple Top)SARS-COV-2 Vaccination 09/09/2019, 09/30/2019,  03/31/2020     Objective: Vital Signs: There were no vitals taken for this visit.   Physical Exam   Musculoskeletal Exam: ***  CDAI Exam: CDAI Score: -- Patient Global: --; Provider Global: -- Swollen: --; Tender: -- Joint Exam 10/27/2020   No joint exam has been documented for this visit   There is currently no information documented on the homunculus. Go to the Rheumatology activity and complete the homunculus joint exam.  Investigation: No additional findings.  Imaging: No results found.  Recent Labs: Lab Results  Component Value Date   WBC 6.0 09/22/2020   HGB 13.5 09/22/2020   PLT 381 09/22/2020   NA 142 09/22/2020   K 4.2 09/22/2020   CL 105 09/22/2020   CO2 28 09/22/2020   GLUCOSE 85 09/22/2020   BUN 12 09/22/2020   CREATININE 0.92 09/22/2020   BILITOT 0.6 09/22/2020   ALKPHOS 77 08/02/2008   AST 16 09/22/2020   ALT 18 09/22/2020   PROT 7.6 09/22/2020   ALBUMIN 3.3 (L) 08/02/2008   CALCIUM 9.6 09/22/2020   GFRAA 77 09/22/2020   September 22, 2020 CK1 03, TSH normal, vitamin D 80, anti-CCP negative, dsDNA negative, anti-SSA negative, Smith negative, RNP negative  01/25/20: ANA 1:40NS, CRP 4.0, RF<14, ESR 22, uric acid 5.0  Speciality Comments: No specialty comments available.  Procedures:  No procedures performed Allergies: Shellfish allergy, Naproxen, Food, Latex, Mucinex [guaifenesin er], and Other   Assessment / Plan:     Visit Diagnoses: No  diagnosis found.  Orders: No orders of the defined types were placed in this encounter.  No orders of the defined types were placed in this encounter.   Face-to-face time spent with patient was *** minutes. Greater than 50% of time was spent in counseling and coordination of care.  Follow-Up Instructions: No follow-ups on file.   Bo Merino, MD  Note - This record has been created using Editor, commissioning.  Chart creation errors have been sought, but may not always  have been located. Such creation  errors do not reflect on  the standard of medical care.

## 2020-10-23 DIAGNOSIS — J3089 Other allergic rhinitis: Secondary | ICD-10-CM | POA: Diagnosis not present

## 2020-10-27 ENCOUNTER — Ambulatory Visit: Payer: Federal, State, Local not specified - PPO | Admitting: Rheumatology

## 2020-10-27 DIAGNOSIS — M19041 Primary osteoarthritis, right hand: Secondary | ICD-10-CM

## 2020-10-27 DIAGNOSIS — M791 Myalgia, unspecified site: Secondary | ICD-10-CM

## 2020-10-27 DIAGNOSIS — Z9109 Other allergy status, other than to drugs and biological substances: Secondary | ICD-10-CM

## 2020-10-27 DIAGNOSIS — R5383 Other fatigue: Secondary | ICD-10-CM

## 2020-10-27 DIAGNOSIS — G4709 Other insomnia: Secondary | ICD-10-CM

## 2020-10-27 DIAGNOSIS — M722 Plantar fascial fibromatosis: Secondary | ICD-10-CM

## 2020-10-27 DIAGNOSIS — R768 Other specified abnormal immunological findings in serum: Secondary | ICD-10-CM

## 2020-11-01 ENCOUNTER — Other Ambulatory Visit: Payer: Self-pay | Admitting: Internal Medicine

## 2020-11-01 DIAGNOSIS — E119 Type 2 diabetes mellitus without complications: Secondary | ICD-10-CM | POA: Diagnosis not present

## 2020-11-01 DIAGNOSIS — Z1231 Encounter for screening mammogram for malignant neoplasm of breast: Secondary | ICD-10-CM

## 2020-11-08 DIAGNOSIS — E119 Type 2 diabetes mellitus without complications: Secondary | ICD-10-CM | POA: Diagnosis not present

## 2020-11-10 DIAGNOSIS — J3089 Other allergic rhinitis: Secondary | ICD-10-CM | POA: Diagnosis not present

## 2020-11-10 DIAGNOSIS — J3081 Allergic rhinitis due to animal (cat) (dog) hair and dander: Secondary | ICD-10-CM | POA: Diagnosis not present

## 2020-11-10 DIAGNOSIS — J301 Allergic rhinitis due to pollen: Secondary | ICD-10-CM | POA: Diagnosis not present

## 2020-11-15 DIAGNOSIS — J301 Allergic rhinitis due to pollen: Secondary | ICD-10-CM | POA: Diagnosis not present

## 2020-11-15 DIAGNOSIS — M791 Myalgia, unspecified site: Secondary | ICD-10-CM | POA: Diagnosis not present

## 2020-11-15 DIAGNOSIS — J3089 Other allergic rhinitis: Secondary | ICD-10-CM | POA: Diagnosis not present

## 2020-11-15 DIAGNOSIS — J3081 Allergic rhinitis due to animal (cat) (dog) hair and dander: Secondary | ICD-10-CM | POA: Diagnosis not present

## 2020-11-18 NOTE — Progress Notes (Signed)
Office Visit Note  Patient: Gloria Brooks             Date of Birth: Nov 12, 1955           MRN: [id number]             PCP: Prince Solian, MD Referring: Prince Solian, MD Visit Date: 11/30/2020 Occupation: '@GUAROCC' @  Subjective Pain in joints and muscles   History of Present Illness: Gloria Brooks is a 65 y.o. female with a history of osteoarthritis and myofascial pain.  She states she started going to integrative therapies after her last visit.  She has noticed progressive improvement in her symptoms.  They have been working with her hands and also the myofascial release which has been helpful.  The pain is mostly on the right side of her body.  She has not had any recent problems with plantar fasciitis.  She denies any history of joint swelling.  Activities of Daily Living:  Patient reports morning stiffness for 0 minutes.   Patient Reports nocturnal pain.  Difficulty dressing/grooming: Denies Difficulty climbing stairs: Denies Difficulty getting out of chair: Denies Difficulty using hands for taps, buttons, cutlery, and/or writing: Denies  Review of Systems  Constitutional: Negative for fatigue.  HENT: Negative for mouth sores, mouth dryness and nose dryness.   Eyes: Positive for itching and dryness. Negative for pain.  Respiratory: Negative for shortness of breath and difficulty breathing.   Cardiovascular: Negative for chest pain and palpitations.  Gastrointestinal: Negative for blood in stool, constipation and diarrhea.  Endocrine: Negative for increased urination.  Genitourinary: Negative for difficulty urinating.  Musculoskeletal: Positive for arthralgias and joint pain. Negative for joint swelling, myalgias, morning stiffness, muscle tenderness and myalgias.  Skin: Negative for color change, rash and redness.  Allergic/Immunologic: Negative for susceptible to infections.  Neurological: Positive for numbness. Negative for dizziness, headaches, memory loss and  weakness.  Hematological: Negative for bruising/bleeding tendency.  Psychiatric/Behavioral: Negative for confusion.    PMFS History:  Patient Active Problem List   Diagnosis Date Noted  . Paresthesia 05/10/2016  . Obesity     Past Medical History:  Diagnosis Date  . Allergy   . Arthritis   . Diabetes mellitus without complication ([place])   . Environmental allergies   . Hyperlipidemia   . Hypertension   . Obesity     Family History  Problem Relation Age of Onset  . Hypertension Mother   . Arthritis Mother   . Hyperlipidemia Mother   . Stroke Mother   . Diabetes Father   . Heart disease Father   . Obesity Sister   . Diabetes Sister   . Diabetes Sister   . Healthy Son   . Healthy Son   . Breast cancer Maternal Aunt   . Breast cancer Cousin    Past Surgical History:  Procedure Laterality Date  . ABDOMINAL HYSTERECTOMY    . CESAREAN SECTION    . TUBAL LIGATION     Social History   Social History Narrative  . Not on file   Immunization History  Administered Date(s) Administered  . PFIZER Comirnaty(Gray Top)Covid-19 Tri-Sucrose Vaccine 10/06/2020  . PFIZER(Purple Top)SARS-COV-2 Vaccination 09/09/2019, 09/30/2019, 03/31/2020     Objective: Vital Signs: BP 128/81 (BP Location: Left Arm, Patient Position: Sitting, Cuff Size: Normal)   Pulse 75   Ht 5' 5.5" (1.664 m)   Wt 160 lb 12.8 oz (72.9 kg)   BMI 26.35 kg/m    Physical Exam Vitals and nursing note reviewed.  Constitutional:      Appearance: She is well-developed.  HENT:     Head: Normocephalic and atraumatic.  Eyes:     Conjunctiva/sclera: Conjunctivae normal.  Cardiovascular:     Rate and Rhythm: Normal rate and regular rhythm.     Heart sounds: Normal heart sounds.  Pulmonary:     Effort: Pulmonary effort is normal.     Breath sounds: Normal breath sounds.  Abdominal:     General: Bowel sounds are normal.     Palpations: Abdomen is soft.  Musculoskeletal:     Cervical back: Normal range of  motion.  Lymphadenopathy:     Cervical: No cervical adenopathy.  Skin:    General: Skin is warm and dry.     Capillary Refill: Capillary refill takes less than 2 seconds.  Neurological:     Mental Status: She is alert and oriented to person, place, and time.  Psychiatric:        Behavior: Behavior normal.      Musculoskeletal Exam: C-spine was in good range of motion.  Shoulder joints, elbow joints, wrist joints with good range of motion.  She had bilateral PIP and DIP thickening with no synovitis.  Hip joints, knee joints, ankles with good range of motion.  She had bilateral pes planus.  She had no tenderness over MTPs or plantar fasciectomy.  She had positive tender points and some hyperalgesia.  CDAI Exam: CDAI Score: -- Patient Global: --; Provider Global: -- Swollen: --; Tender: -- Joint Exam 11/30/2020   No joint exam has been documented for this visit   There is currently no information documented on the homunculus. Go to the Rheumatology activity and complete the homunculus joint exam.  Investigation: No additional findings.  Imaging: No results found.  Recent Labs: Lab Results  Component Value Date   WBC 6.0 09/22/2020   HGB 13.5 09/22/2020   PLT 381 09/22/2020   NA 142 09/22/2020   K 4.2 09/22/2020   CL 105 09/22/2020   CO2 28 09/22/2020   GLUCOSE 85 09/22/2020   BUN 12 09/22/2020   CREATININE 0.92 09/22/2020   BILITOT 0.6 09/22/2020   ALKPHOS 77 08/02/2008   AST 16 09/22/2020   ALT 18 09/22/2020   PROT 7.6 09/22/2020   ALBUMIN 3.3 (L) 08/02/2008   CALCIUM 9.6 09/22/2020   GFRAA 77 09/22/2020   09/22/20 CK 103,TSH normal,dsDNA-Sm-,RNP-,Ro-, anti-CCP-,Vit D 80  01/25/20: ANA 1:40NS, CRP 4.0, RF<14, ESR 22, uric acid 5.0  Speciality Comments: No specialty comments available.  Procedures:  No procedures performed Allergies: Shellfish allergy, Naproxen, Food, Latex, Mucinex [guaifenesin er], and Other   Assessment / Plan:     Visit Diagnoses:  Positive ANA (antinuclear antibody) - ANA 1: 40 speckled, ENA negative.  Left findings were discussed with the patient at length.  There is no history of oral ulcers, nasal ulcers, malar rash, photosensitivity, Raynaud's phenomenon, inflammatory arthritis or lymphadenopathy.  Pain in right hand - Clinical and radiographic findings are consistent with osteoarthritis.  All autoimmune work-up is negative.  Radiographic findings were discussed with the patient.  Joint protection muscle strengthening was discussed.  She is also going to physical therapy which has been helpful.  Dry eyes-over-the-counter products were discussed.  I believe its from looking at the computer.  Myofascial pain syndrome - History of episodic generalized pain.  CK is normal.  She has been under a lot of stress.  She is going to physical therapy which has been helpful.  Need for regular  exercise, water aerobics and stretching was emphasized.  Other fatigue-related to insomnia.  Other insomnia-sleep hygiene was discussed.  Plantar fasciitis - Followed by Dr. Cannon Kettle.  She has not had any recent episode of [place] fasciitis.  Environmental allergies  Orders: No orders of the defined types were placed in this encounter.  No orders of the defined types were placed in this encounter.   Follow-Up Instructions: Return if symptoms worsen or fail to improve, for Osteoarthritis.   Bo Merino, MD  Note - This record has been created using Editor, commissioning.  Chart creation errors have been sought, but may not always  have been located. Such creation errors do not reflect on  the standard of medical care.

## 2020-11-24 DIAGNOSIS — M791 Myalgia, unspecified site: Secondary | ICD-10-CM | POA: Diagnosis not present

## 2020-11-24 DIAGNOSIS — J301 Allergic rhinitis due to pollen: Secondary | ICD-10-CM | POA: Diagnosis not present

## 2020-11-24 DIAGNOSIS — J3089 Other allergic rhinitis: Secondary | ICD-10-CM | POA: Diagnosis not present

## 2020-11-24 DIAGNOSIS — J3081 Allergic rhinitis due to animal (cat) (dog) hair and dander: Secondary | ICD-10-CM | POA: Diagnosis not present

## 2020-11-28 DIAGNOSIS — J301 Allergic rhinitis due to pollen: Secondary | ICD-10-CM | POA: Diagnosis not present

## 2020-11-28 DIAGNOSIS — J3089 Other allergic rhinitis: Secondary | ICD-10-CM | POA: Diagnosis not present

## 2020-11-28 DIAGNOSIS — J3081 Allergic rhinitis due to animal (cat) (dog) hair and dander: Secondary | ICD-10-CM | POA: Diagnosis not present

## 2020-11-30 ENCOUNTER — Encounter: Payer: Self-pay | Admitting: Rheumatology

## 2020-11-30 ENCOUNTER — Other Ambulatory Visit: Payer: Self-pay

## 2020-11-30 ENCOUNTER — Ambulatory Visit: Payer: Federal, State, Local not specified - PPO | Admitting: Rheumatology

## 2020-11-30 VITALS — BP 128/81 | HR 75 | Ht 65.5 in | Wt 160.8 lb

## 2020-11-30 DIAGNOSIS — Z9109 Other allergy status, other than to drugs and biological substances: Secondary | ICD-10-CM

## 2020-11-30 DIAGNOSIS — M7918 Myalgia, other site: Secondary | ICD-10-CM | POA: Diagnosis not present

## 2020-11-30 DIAGNOSIS — M722 Plantar fascial fibromatosis: Secondary | ICD-10-CM

## 2020-11-30 DIAGNOSIS — M791 Myalgia, unspecified site: Secondary | ICD-10-CM

## 2020-11-30 DIAGNOSIS — G4709 Other insomnia: Secondary | ICD-10-CM

## 2020-11-30 DIAGNOSIS — R768 Other specified abnormal immunological findings in serum: Secondary | ICD-10-CM | POA: Diagnosis not present

## 2020-11-30 DIAGNOSIS — R5383 Other fatigue: Secondary | ICD-10-CM

## 2020-11-30 DIAGNOSIS — H04123 Dry eye syndrome of bilateral lacrimal glands: Secondary | ICD-10-CM | POA: Diagnosis not present

## 2020-11-30 DIAGNOSIS — M79641 Pain in right hand: Secondary | ICD-10-CM

## 2020-11-30 NOTE — Patient Instructions (Signed)
Hand Exercises Hand exercises can be helpful for almost anyone. These exercises can strengthen the hands, improve flexibility and movement, and increase blood flow to the hands. These results can make work and daily tasks easier. Hand exercises can be especially helpful for people who have joint pain from arthritis or have nerve damage from overuse (carpal tunnel syndrome). These exercises can also help people who have injured a hand. Exercises Most of these hand exercises are gentle stretching and motion exercises. It is usually safe to do them often throughout the day. Warming up your hands before exercise may help to reduce stiffness. You can do this with gentle massage or by placing your hands in warm water for 10-15 minutes. It is normal to feel some stretching, pulling, tightness, or mild discomfort as you begin new exercises. This will gradually improve. Stop an exercise right away if you feel sudden, severe pain or your pain gets worse. Ask your health care provider which exercises are best for you. Knuckle bend or "claw" fist 1. Stand or sit with your arm, hand, and all five fingers pointed straight up. Make sure to keep your wrist straight during the exercise. 2. Gently bend your fingers down toward your palm until the tips of your fingers are touching the top of your palm. Keep your big knuckle straight and just bend the small knuckles in your fingers. 3. Hold this position for __________ seconds. 4. Straighten (extend) your fingers back to the starting position. Repeat this exercise 5-10 times with each hand. Full finger fist 1. Stand or sit with your arm, hand, and all five fingers pointed straight up. Make sure to keep your wrist straight during the exercise. 2. Gently bend your fingers into your palm until the tips of your fingers are touching the middle of your palm. 3. Hold this position for __________ seconds. 4. Extend your fingers back to the starting position, stretching every  joint fully. Repeat this exercise 5-10 times with each hand. Straight fist 1. Stand or sit with your arm, hand, and all five fingers pointed straight up. Make sure to keep your wrist straight during the exercise. 2. Gently bend your fingers at the big knuckle, where your fingers meet your hand, and the middle knuckle. Keep the knuckle at the tips of your fingers straight and try to touch the bottom of your palm. 3. Hold this position for __________ seconds. 4. Extend your fingers back to the starting position, stretching every joint fully. Repeat this exercise 5-10 times with each hand. Tabletop 1. Stand or sit with your arm, hand, and all five fingers pointed straight up. Make sure to keep your wrist straight during the exercise. 2. Gently bend your fingers at the big knuckle, where your fingers meet your hand, as far down as you can while keeping the small knuckles in your fingers straight. Think of forming a tabletop with your fingers. 3. Hold this position for __________ seconds. 4. Extend your fingers back to the starting position, stretching every joint fully. Repeat this exercise 5-10 times with each hand. Finger spread 1. Place your hand flat on a table with your palm facing down. Make sure your wrist stays straight as you do this exercise. 2. Spread your fingers and thumb apart from each other as far as you can until you feel a gentle stretch. Hold this position for __________ seconds. 3. Bring your fingers and thumb tight together again. Hold this position for __________ seconds. Repeat this exercise 5-10 times with each hand.   Making circles 1. Stand or sit with your arm, hand, and all five fingers pointed straight up. Make sure to keep your wrist straight during the exercise. 2. Make a circle by touching the tip of your thumb to the tip of your index finger. 3. Hold for __________ seconds. Then open your hand wide. 4. Repeat this motion with your thumb and each finger on your  hand. Repeat this exercise 5-10 times with each hand. Thumb motion 1. Sit with your forearm resting on a table and your wrist straight. Your thumb should be facing up toward the ceiling. Keep your fingers relaxed as you move your thumb. 2. Lift your thumb up as high as you can toward the ceiling. Hold for __________ seconds. 3. Bend your thumb across your palm as far as you can, reaching the tip of your thumb for the small finger (pinkie) side of your palm. Hold for __________ seconds. Repeat this exercise 5-10 times with each hand. Grip strengthening 1. Hold a stress ball or other soft ball in the middle of your hand. 2. Slowly increase the pressure, squeezing the ball as much as you can without causing pain. Think of bringing the tips of your fingers into the middle of your palm. All of your finger joints should bend when doing this exercise. 3. Hold your squeeze for __________ seconds, then relax. Repeat this exercise 5-10 times with each hand.   Contact a health care provider if:  Your hand pain or discomfort gets much worse when you do an exercise.  Your hand pain or discomfort does not improve within 2 hours after you exercise. If you have any of these problems, stop doing these exercises right away. Do not do them again unless your health care provider says that you can. Get help right away if:  You develop sudden, severe hand pain or swelling. If this happens, stop doing these exercises right away. Do not do them again unless your health care provider says that you can. This information is not intended to replace advice given to you by your health care provider. Make sure you discuss any questions you have with your health care provider. Document Revised: 10/08/2018 Document Reviewed: 06/18/2018 Elsevier Patient Education  2021 [employer]  

## 2020-12-06 DIAGNOSIS — J301 Allergic rhinitis due to pollen: Secondary | ICD-10-CM | POA: Diagnosis not present

## 2020-12-06 DIAGNOSIS — J3081 Allergic rhinitis due to animal (cat) (dog) hair and dander: Secondary | ICD-10-CM | POA: Diagnosis not present

## 2020-12-06 DIAGNOSIS — J3089 Other allergic rhinitis: Secondary | ICD-10-CM | POA: Diagnosis not present

## 2020-12-15 DIAGNOSIS — J3089 Other allergic rhinitis: Secondary | ICD-10-CM | POA: Diagnosis not present

## 2020-12-15 DIAGNOSIS — J301 Allergic rhinitis due to pollen: Secondary | ICD-10-CM | POA: Diagnosis not present

## 2020-12-15 DIAGNOSIS — J3081 Allergic rhinitis due to animal (cat) (dog) hair and dander: Secondary | ICD-10-CM | POA: Diagnosis not present

## 2020-12-29 DIAGNOSIS — J3089 Other allergic rhinitis: Secondary | ICD-10-CM | POA: Diagnosis not present

## 2020-12-29 DIAGNOSIS — J3081 Allergic rhinitis due to animal (cat) (dog) hair and dander: Secondary | ICD-10-CM | POA: Diagnosis not present

## 2020-12-29 DIAGNOSIS — J301 Allergic rhinitis due to pollen: Secondary | ICD-10-CM | POA: Diagnosis not present

## 2021-01-02 ENCOUNTER — Ambulatory Visit
Admission: RE | Admit: 2021-01-02 | Discharge: 2021-01-02 | Disposition: A | Discharge status: Home / Self Care | Payer: Federal, State, Local not specified - PPO | Source: Ambulatory Visit | Attending: Internal Medicine | Admitting: Internal Medicine

## 2021-01-02 ENCOUNTER — Other Ambulatory Visit: Payer: Self-pay

## 2021-01-02 DIAGNOSIS — Z1231 Encounter for screening mammogram for malignant neoplasm of breast: Secondary | ICD-10-CM | POA: Diagnosis not present

## 2021-01-02 DIAGNOSIS — J3089 Other allergic rhinitis: Secondary | ICD-10-CM | POA: Diagnosis not present

## 2021-01-02 DIAGNOSIS — J3081 Allergic rhinitis due to animal (cat) (dog) hair and dander: Secondary | ICD-10-CM | POA: Diagnosis not present

## 2021-01-02 DIAGNOSIS — J301 Allergic rhinitis due to pollen: Secondary | ICD-10-CM | POA: Diagnosis not present

## 2021-01-19 DIAGNOSIS — J301 Allergic rhinitis due to pollen: Secondary | ICD-10-CM | POA: Diagnosis not present

## 2021-01-19 DIAGNOSIS — J3081 Allergic rhinitis due to animal (cat) (dog) hair and dander: Secondary | ICD-10-CM | POA: Diagnosis not present

## 2021-01-19 DIAGNOSIS — J3089 Other allergic rhinitis: Secondary | ICD-10-CM | POA: Diagnosis not present

## 2021-01-26 DIAGNOSIS — J3089 Other allergic rhinitis: Secondary | ICD-10-CM | POA: Diagnosis not present

## 2021-01-26 DIAGNOSIS — J301 Allergic rhinitis due to pollen: Secondary | ICD-10-CM | POA: Diagnosis not present

## 2021-01-26 DIAGNOSIS — J3081 Allergic rhinitis due to animal (cat) (dog) hair and dander: Secondary | ICD-10-CM | POA: Diagnosis not present

## 2021-01-31 DIAGNOSIS — J3081 Allergic rhinitis due to animal (cat) (dog) hair and dander: Secondary | ICD-10-CM | POA: Diagnosis not present

## 2021-01-31 DIAGNOSIS — J029 Acute pharyngitis, unspecified: Secondary | ICD-10-CM | POA: Diagnosis not present

## 2021-01-31 DIAGNOSIS — J301 Allergic rhinitis due to pollen: Secondary | ICD-10-CM | POA: Diagnosis not present

## 2021-01-31 DIAGNOSIS — J3089 Other allergic rhinitis: Secondary | ICD-10-CM | POA: Diagnosis not present

## 2021-01-31 DIAGNOSIS — Z20822 Contact with and (suspected) exposure to covid-19: Secondary | ICD-10-CM | POA: Diagnosis not present

## 2021-01-31 DIAGNOSIS — R3915 Urgency of urination: Secondary | ICD-10-CM | POA: Diagnosis not present

## 2021-01-31 DIAGNOSIS — M549 Dorsalgia, unspecified: Secondary | ICD-10-CM | POA: Diagnosis not present

## 2021-02-07 DIAGNOSIS — E1169 Type 2 diabetes mellitus with other specified complication: Secondary | ICD-10-CM | POA: Diagnosis not present

## 2021-02-07 DIAGNOSIS — I129 Hypertensive chronic kidney disease with stage 1 through stage 4 chronic kidney disease, or unspecified chronic kidney disease: Secondary | ICD-10-CM | POA: Diagnosis not present

## 2021-02-09 DIAGNOSIS — J301 Allergic rhinitis due to pollen: Secondary | ICD-10-CM | POA: Diagnosis not present

## 2021-02-09 DIAGNOSIS — J3089 Other allergic rhinitis: Secondary | ICD-10-CM | POA: Diagnosis not present

## 2021-02-09 DIAGNOSIS — J3081 Allergic rhinitis due to animal (cat) (dog) hair and dander: Secondary | ICD-10-CM | POA: Diagnosis not present

## 2021-02-16 DIAGNOSIS — J301 Allergic rhinitis due to pollen: Secondary | ICD-10-CM | POA: Diagnosis not present

## 2021-02-16 DIAGNOSIS — J3089 Other allergic rhinitis: Secondary | ICD-10-CM | POA: Diagnosis not present

## 2021-02-16 DIAGNOSIS — J3081 Allergic rhinitis due to animal (cat) (dog) hair and dander: Secondary | ICD-10-CM | POA: Diagnosis not present

## 2021-02-23 DIAGNOSIS — J3089 Other allergic rhinitis: Secondary | ICD-10-CM | POA: Diagnosis not present

## 2021-02-23 DIAGNOSIS — J3081 Allergic rhinitis due to animal (cat) (dog) hair and dander: Secondary | ICD-10-CM | POA: Diagnosis not present

## 2021-02-23 DIAGNOSIS — J301 Allergic rhinitis due to pollen: Secondary | ICD-10-CM | POA: Diagnosis not present

## 2021-03-06 DIAGNOSIS — J301 Allergic rhinitis due to pollen: Secondary | ICD-10-CM | POA: Diagnosis not present

## 2021-03-06 DIAGNOSIS — J3081 Allergic rhinitis due to animal (cat) (dog) hair and dander: Secondary | ICD-10-CM | POA: Diagnosis not present

## 2021-03-06 DIAGNOSIS — J3089 Other allergic rhinitis: Secondary | ICD-10-CM | POA: Diagnosis not present

## 2021-03-21 DIAGNOSIS — I129 Hypertensive chronic kidney disease with stage 1 through stage 4 chronic kidney disease, or unspecified chronic kidney disease: Secondary | ICD-10-CM | POA: Diagnosis not present

## 2021-03-21 DIAGNOSIS — Z23 Encounter for immunization: Secondary | ICD-10-CM | POA: Diagnosis not present

## 2021-03-28 DIAGNOSIS — J3089 Other allergic rhinitis: Secondary | ICD-10-CM | POA: Diagnosis not present

## 2021-03-28 DIAGNOSIS — J301 Allergic rhinitis due to pollen: Secondary | ICD-10-CM | POA: Diagnosis not present

## 2021-03-28 DIAGNOSIS — J3081 Allergic rhinitis due to animal (cat) (dog) hair and dander: Secondary | ICD-10-CM | POA: Diagnosis not present

## 2021-04-04 DIAGNOSIS — J3089 Other allergic rhinitis: Secondary | ICD-10-CM | POA: Diagnosis not present

## 2021-04-04 DIAGNOSIS — J3081 Allergic rhinitis due to animal (cat) (dog) hair and dander: Secondary | ICD-10-CM | POA: Diagnosis not present

## 2021-04-04 DIAGNOSIS — J301 Allergic rhinitis due to pollen: Secondary | ICD-10-CM | POA: Diagnosis not present

## 2021-04-19 DIAGNOSIS — J3089 Other allergic rhinitis: Secondary | ICD-10-CM | POA: Diagnosis not present

## 2021-04-19 DIAGNOSIS — Z91013 Allergy to seafood: Secondary | ICD-10-CM | POA: Diagnosis not present

## 2021-04-19 DIAGNOSIS — J4540 Moderate persistent asthma, uncomplicated: Secondary | ICD-10-CM | POA: Diagnosis not present

## 2021-04-19 DIAGNOSIS — J3081 Allergic rhinitis due to animal (cat) (dog) hair and dander: Secondary | ICD-10-CM | POA: Diagnosis not present

## 2021-04-19 DIAGNOSIS — J301 Allergic rhinitis due to pollen: Secondary | ICD-10-CM | POA: Diagnosis not present

## 2021-05-02 DIAGNOSIS — J3089 Other allergic rhinitis: Secondary | ICD-10-CM | POA: Diagnosis not present

## 2021-05-02 DIAGNOSIS — J301 Allergic rhinitis due to pollen: Secondary | ICD-10-CM | POA: Diagnosis not present

## 2021-05-02 DIAGNOSIS — J3081 Allergic rhinitis due to animal (cat) (dog) hair and dander: Secondary | ICD-10-CM | POA: Diagnosis not present

## 2021-05-08 DIAGNOSIS — E1169 Type 2 diabetes mellitus with other specified complication: Secondary | ICD-10-CM | POA: Diagnosis not present

## 2021-05-08 DIAGNOSIS — E785 Hyperlipidemia, unspecified: Secondary | ICD-10-CM | POA: Diagnosis not present

## 2021-05-08 DIAGNOSIS — E559 Vitamin D deficiency, unspecified: Secondary | ICD-10-CM | POA: Diagnosis not present

## 2021-05-10 DIAGNOSIS — J301 Allergic rhinitis due to pollen: Secondary | ICD-10-CM | POA: Diagnosis not present

## 2021-05-10 DIAGNOSIS — J3089 Other allergic rhinitis: Secondary | ICD-10-CM | POA: Diagnosis not present

## 2021-05-10 DIAGNOSIS — J3081 Allergic rhinitis due to animal (cat) (dog) hair and dander: Secondary | ICD-10-CM | POA: Diagnosis not present

## 2021-05-14 DIAGNOSIS — J301 Allergic rhinitis due to pollen: Secondary | ICD-10-CM | POA: Diagnosis not present

## 2021-05-14 DIAGNOSIS — J3081 Allergic rhinitis due to animal (cat) (dog) hair and dander: Secondary | ICD-10-CM | POA: Diagnosis not present

## 2021-05-14 DIAGNOSIS — J3089 Other allergic rhinitis: Secondary | ICD-10-CM | POA: Diagnosis not present

## 2021-05-15 DIAGNOSIS — Z0000 Encounter for general adult medical examination without abnormal findings: Secondary | ICD-10-CM | POA: Diagnosis not present

## 2021-05-15 DIAGNOSIS — E1169 Type 2 diabetes mellitus with other specified complication: Secondary | ICD-10-CM | POA: Diagnosis not present

## 2021-05-15 DIAGNOSIS — Z1212 Encounter for screening for malignant neoplasm of rectum: Secondary | ICD-10-CM | POA: Diagnosis not present

## 2021-05-15 DIAGNOSIS — I129 Hypertensive chronic kidney disease with stage 1 through stage 4 chronic kidney disease, or unspecified chronic kidney disease: Secondary | ICD-10-CM | POA: Diagnosis not present

## 2021-05-15 DIAGNOSIS — R82998 Other abnormal findings in urine: Secondary | ICD-10-CM | POA: Diagnosis not present

## 2021-05-23 DIAGNOSIS — J301 Allergic rhinitis due to pollen: Secondary | ICD-10-CM | POA: Diagnosis not present

## 2021-05-23 DIAGNOSIS — J3081 Allergic rhinitis due to animal (cat) (dog) hair and dander: Secondary | ICD-10-CM | POA: Diagnosis not present

## 2021-05-23 DIAGNOSIS — J3089 Other allergic rhinitis: Secondary | ICD-10-CM | POA: Diagnosis not present

## 2021-05-30 DIAGNOSIS — J3081 Allergic rhinitis due to animal (cat) (dog) hair and dander: Secondary | ICD-10-CM | POA: Diagnosis not present

## 2021-05-30 DIAGNOSIS — J3089 Other allergic rhinitis: Secondary | ICD-10-CM | POA: Diagnosis not present

## 2021-05-30 DIAGNOSIS — J301 Allergic rhinitis due to pollen: Secondary | ICD-10-CM | POA: Diagnosis not present

## 2021-06-08 DIAGNOSIS — J3081 Allergic rhinitis due to animal (cat) (dog) hair and dander: Secondary | ICD-10-CM | POA: Diagnosis not present

## 2021-06-08 DIAGNOSIS — J301 Allergic rhinitis due to pollen: Secondary | ICD-10-CM | POA: Diagnosis not present

## 2021-06-08 DIAGNOSIS — J3089 Other allergic rhinitis: Secondary | ICD-10-CM | POA: Diagnosis not present

## 2021-06-13 DIAGNOSIS — J3081 Allergic rhinitis due to animal (cat) (dog) hair and dander: Secondary | ICD-10-CM | POA: Diagnosis not present

## 2021-06-13 DIAGNOSIS — J301 Allergic rhinitis due to pollen: Secondary | ICD-10-CM | POA: Diagnosis not present

## 2021-06-13 DIAGNOSIS — J3089 Other allergic rhinitis: Secondary | ICD-10-CM | POA: Diagnosis not present

## 2021-06-14 ENCOUNTER — Encounter: Payer: Self-pay | Admitting: Gastroenterology

## 2021-06-19 ENCOUNTER — Encounter (HOSPITAL_BASED_OUTPATIENT_CLINIC_OR_DEPARTMENT_OTHER): Payer: Self-pay

## 2021-06-19 ENCOUNTER — Emergency Department (HOSPITAL_BASED_OUTPATIENT_CLINIC_OR_DEPARTMENT_OTHER)
Admission: EM | Admit: 2021-06-19 | Discharge: 2021-06-19 | Disposition: A | Discharge status: Home / Self Care | Payer: Federal, State, Local not specified - PPO | Attending: Emergency Medicine | Admitting: Emergency Medicine

## 2021-06-19 ENCOUNTER — Emergency Department (HOSPITAL_BASED_OUTPATIENT_CLINIC_OR_DEPARTMENT_OTHER): Payer: Federal, State, Local not specified - PPO

## 2021-06-19 ENCOUNTER — Other Ambulatory Visit: Payer: Self-pay

## 2021-06-19 DIAGNOSIS — Z91040 Latex allergy status: Secondary | ICD-10-CM | POA: Diagnosis not present

## 2021-06-19 DIAGNOSIS — E119 Type 2 diabetes mellitus without complications: Secondary | ICD-10-CM | POA: Insufficient documentation

## 2021-06-19 DIAGNOSIS — R0789 Other chest pain: Secondary | ICD-10-CM | POA: Diagnosis not present

## 2021-06-19 DIAGNOSIS — I10 Essential (primary) hypertension: Secondary | ICD-10-CM | POA: Insufficient documentation

## 2021-06-19 DIAGNOSIS — R1013 Epigastric pain: Secondary | ICD-10-CM | POA: Insufficient documentation

## 2021-06-19 DIAGNOSIS — R079 Chest pain, unspecified: Secondary | ICD-10-CM | POA: Diagnosis not present

## 2021-06-19 LAB — CBC
HCT: 39.4 % (ref 36.0–46.0)
Hemoglobin: 12.5 g/dL (ref 12.0–15.0)
MCH: 27.4 pg (ref 26.0–34.0)
MCHC: 31.7 g/dL (ref 30.0–36.0)
MCV: 86.2 fL (ref 80.0–100.0)
Platelets: 336 10*3/uL (ref 150–400)
RBC: 4.57 MIL/uL (ref 3.87–5.11)
RDW: 13.4 % (ref 11.5–15.5)
WBC: 6.3 10*3/uL (ref 4.0–10.5)
nRBC: 0 % (ref 0.0–0.2)

## 2021-06-19 LAB — BASIC METABOLIC PANEL
Anion gap: 8 (ref 5–15)
BUN: 9 mg/dL (ref 8–23)
CO2: 28 mmol/L (ref 22–32)
Calcium: 9.1 mg/dL (ref 8.9–10.3)
Chloride: 102 mmol/L (ref 98–111)
Creatinine, Ser: 0.75 mg/dL (ref 0.44–1.00)
GFR, Estimated: >60 mL/min (ref >60)
Glucose, Bld: 105 mg/dL — ABNORMAL HIGH (ref 70–99)
Potassium: 3.4 mmol/L — ABNORMAL LOW (ref 3.5–5.1)
Sodium: 138 mmol/L (ref 135–145)

## 2021-06-19 LAB — HEPATIC FUNCTION PANEL
ALT: 17 U/L (ref 0–44)
AST: 20 U/L (ref 15–41)
Albumin: 3.8 g/dL (ref 3.5–5.0)
Alkaline Phosphatase: 77 U/L (ref 38–126)
Bilirubin, Direct: <0.1 mg/dL (ref 0.0–0.2)
Total Bilirubin: 0.4 mg/dL (ref 0.3–1.2)
Total Protein: 7.5 g/dL (ref 6.5–8.1)

## 2021-06-19 LAB — TROPONIN I (HIGH SENSITIVITY)
Troponin I (High Sensitivity): <2 ng/L (ref <18)
Troponin I (High Sensitivity): <2 ng/L (ref <18)

## 2021-06-19 LAB — LIPASE, BLOOD: Lipase: 30 U/L (ref 11–51)

## 2021-06-19 MED ORDER — ALUM & MAG HYDROXIDE-SIMETH 200-200-20 MG/5ML PO SUSP
15.0000 mL | Freq: Once | ORAL | Status: AC
  Administered 2021-06-19: 13:00:00 15 mL via ORAL
  Filled 2021-06-19: qty 30

## 2021-06-19 MED ORDER — MAALOX MAX 400-400-40 MG/5ML PO SUSP: 10.0000 mL | Freq: Four times a day (QID) | ORAL | 0 refills | Status: AC | PRN

## 2021-06-19 NOTE — Discharge Instructions (Signed)

## 2021-06-19 NOTE — ED Notes (Signed)
ED Provider at bedside. 

## 2021-06-19 NOTE — ED Triage Notes (Signed)
Pt c/o CP x 2 week-states she has also had cough, URI sx-NAD-steady gait

## 2021-06-19 NOTE — ED Provider Notes (Signed)
Emergency Department Provider Note   I have reviewed the triage vital signs and the nursing notes.   HISTORY  Chief Complaint Chest Pain   HPI Gloria Brooks is a 65 y.o. female with past medical history reviewed below presents emergency department with a burning epigastric/lower chest pain radiating to the back.  She denies any ripping/tearing pain.  No pressure into the left chest or arm.  She states in the recent past she has had some discomfort in the chest rating to the arm but today's pain feels much different.  Its burning/searing type pain.  No clear association with eating or drinking.  She notes a history of GERD states this feels more severe.  She does have an appointment coming up with her GI doctor discussed colonoscopy and had considered possible upper endoscopy at that appointment as well.  She does remain compliant with her PPI.    Past Medical History:  Diagnosis Date   Allergy    Arthritis    Diabetes mellitus without complication ([facility])    Environmental allergies    Hyperlipidemia    Hypertension    Obesity     Patient Active Problem List   Diagnosis Date Noted   Paresthesia 05/10/2016   Obesity     Past Surgical History:  Procedure Laterality Date   ABDOMINAL HYSTERECTOMY     CESAREAN SECTION     TUBAL LIGATION      Allergies Shellfish allergy, Naproxen, Food, Latex, Mucinex [guaifenesin er], and Other  Family History  Problem Relation Age of Onset   Hypertension Mother    Arthritis Mother    Hyperlipidemia Mother    Stroke Mother    Diabetes Father    Heart disease Father    Obesity Sister    Diabetes Sister    Diabetes Sister    Healthy Son    Healthy Son    Breast cancer Maternal Aunt    Breast cancer Cousin     Social History Social History   Tobacco Use   Smoking status: Never   Smokeless tobacco: Never  Vaping Use   Vaping Use: Never used  Substance Use Topics   Alcohol use: No   Drug use: No    Review of  Systems  Constitutional: No fever/chills Eyes: No visual changes. ENT: No sore throat. Cardiovascular: Positive chest pain. Respiratory: Denies shortness of breath. Gastrointestinal: Positive epigastric abdominal pain.  No nausea, no vomiting.  No diarrhea.  No constipation. Genitourinary: Negative for dysuria. Musculoskeletal: Negative for back pain. Skin: Negative for rash. Neurological: Negative for headaches, focal weakness or numbness.  10-point ROS otherwise negative.  ____________________________________________   PHYSICAL EXAM:  VITAL SIGNS: ED Triage Vitals  Enc Vitals Group     BP 06/19/21 1206 (!) 192/95     Pulse Rate 06/19/21 1206 82     Resp 06/19/21 1206 20     Temp 06/19/21 1206 97.7 F (36.5 C)     Temp Source 06/19/21 1206 Oral     SpO2 06/19/21 1206 100 %     Weight 06/19/21 1209 162 lb (73.5 kg)     Height 06/19/21 1209 5\' 6"  (1.676 m)   Constitutional: Alert and oriented. Well appearing and in no acute distress. Eyes: Conjunctivae are normal.  Head: Atraumatic. Nose: No congestion/rhinnorhea. Mouth/Throat: Mucous membranes are moist.  Oropharynx non-erythematous. Neck: No stridor.   Cardiovascular: Normal rate, regular rhythm. Good peripheral circulation. Grossly normal heart sounds.   Respiratory: Normal respiratory effort.  No retractions.  Lungs CTAB. Gastrointestinal: Soft with mild mid-epigastric tenderness. No rebound or guarding. Negative Murphy's sign. No distention.  Musculoskeletal: No lower extremity tenderness nor edema. No gross deformities of extremities. Neurologic:  Normal speech and language. No gross focal neurologic deficits are appreciated.  Skin:  Skin is warm, dry and intact. No rash noted.  ____________________________________________   LABS (all labs ordered are listed, but only abnormal results are displayed)  Labs Reviewed  BASIC METABOLIC PANEL - Abnormal; Notable for the following components:      Result Value    Potassium 3.4 (*)    Glucose, Bld 105 (*)    All other components within normal limits  CBC  HEPATIC FUNCTION PANEL  LIPASE, BLOOD  TROPONIN I (HIGH SENSITIVITY)  TROPONIN I (HIGH SENSITIVITY)   ____________________________________________  EKG   EKG Interpretation  Date/Time:  Tuesday June 19 2021 12:11:14 EST Ventricular Rate:  72 PR Interval:  156 QRS Duration: 80 QT Interval:  402 QTC Calculation: 440 R Axis:   -27 Text Interpretation: Normal sinus rhythm Minimal voltage criteria for LVH, may be normal variant ( R in aVL ) Nonspecific T wave abnormality Abnormal ECG Confirmed by Alona BeneLong, [place] [phone]([id number]) on 06/19/2021 12:27:19 PM        ____________________________________________  RADIOLOGY  DG Chest Portable 1 View  Result Date: 06/19/2021 CLINICAL DATA:  Chest pain with flu like symptoms in a [age] year old female. EXAM: PORTABLE CHEST 1 VIEW COMPARISON:  August 24, 2018. FINDINGS: Trachea midline. Cardiomediastinal contours and hilar structures are normal. No signs of consolidation or evidence of pleural effusion on frontal radiograph. No pneumothorax. On limited assessment there is no acute skeletal process. IMPRESSION: No acute cardiopulmonary disease. Electronically Signed   By: Donzetta KohutGeoffrey  Wile M.D.   On: 06/19/2021 12:35    ____________________________________________   PROCEDURES  Procedure(s) performed:   Procedures  None  ____________________________________________   INITIAL IMPRESSION / ASSESSMENT AND PLAN / ED COURSE  Pertinent labs & imaging results that were available during my care of the patient were reviewed by me and considered in my medical decision making (see chart for details).   Patient presents emergency department for evaluation of epigastric and some chest pain.  Seems burning/GI in quality.  Patient describes some remote history of pain in the left arm but not today.  EKG interpreted by me as above with no acute ischemic change.    Differential includes all life-threatening causes for chest pain. This includes but is not exclusive to acute coronary syndrome, aortic dissection, pulmonary embolism, cardiac tamponade, community-acquired pneumonia, pericarditis, musculoskeletal chest wall pain, etc.  Patient's lab work here is reviewed.  She has negative troponin x2.  Normal LFTs and bilirubin.  Normal lipase.  Chest x-ray shows no acute findings.  Plan for Maalox and she will discuss with her PCP and GI physicians regarding possible upper endoscopy. Discussed strict ED return precautions.   ____________________________________________  FINAL CLINICAL IMPRESSION(S) / ED DIAGNOSES  Final diagnoses:  Epigastric pain     MEDICATIONS GIVEN DURING THIS VISIT:  Medications  alum & mag hydroxide-simeth (MAALOX/MYLANTA) 200-200-20 MG/5ML suspension 15 mL (15 mLs Oral Given 06/19/21 1310)     NEW OUTPATIENT MEDICATIONS STARTED DURING THIS VISIT:  Discharge Medication List as of 06/19/2021  3:11 PM     START taking these medications   Details  alum & mag hydroxide-simeth (MAALOX MAX) 400-400-40 MG/5ML suspension Take 10 mLs by mouth every 6 (six) hours as needed for indigestion., Starting Tue 06/19/2021, Normal  Note:  This document was prepared using Dragon voice recognition software and may include unintentional dictation errors.  Nanda Quinton, MD, [facility] Emergency Medicine    Bernal Luhman, [place], MD 06/20/21 [phone]

## 2021-06-22 DIAGNOSIS — J301 Allergic rhinitis due to pollen: Secondary | ICD-10-CM | POA: Insufficient documentation

## 2021-06-22 DIAGNOSIS — J4540 Moderate persistent asthma, uncomplicated: Secondary | ICD-10-CM | POA: Insufficient documentation

## 2021-06-22 DIAGNOSIS — J3081 Allergic rhinitis due to animal (cat) (dog) hair and dander: Secondary | ICD-10-CM | POA: Diagnosis not present

## 2021-06-22 DIAGNOSIS — Z91013 Allergy to seafood: Secondary | ICD-10-CM | POA: Insufficient documentation

## 2021-06-22 DIAGNOSIS — H1045 Other chronic allergic conjunctivitis: Secondary | ICD-10-CM | POA: Insufficient documentation

## 2021-06-22 DIAGNOSIS — J3089 Other allergic rhinitis: Secondary | ICD-10-CM | POA: Diagnosis not present

## 2021-06-22 DIAGNOSIS — L509 Urticaria, unspecified: Secondary | ICD-10-CM | POA: Insufficient documentation

## 2021-06-28 ENCOUNTER — Ambulatory Visit: Payer: Federal, State, Local not specified - PPO | Admitting: Nurse Practitioner

## 2021-06-28 ENCOUNTER — Encounter: Payer: Self-pay | Admitting: Nurse Practitioner

## 2021-06-28 VITALS — BP 106/60 | HR 80 | Ht 66.0 in | Wt 159.0 lb

## 2021-06-28 DIAGNOSIS — K219 Gastro-esophageal reflux disease without esophagitis: Secondary | ICD-10-CM

## 2021-06-28 DIAGNOSIS — R0789 Other chest pain: Secondary | ICD-10-CM | POA: Diagnosis not present

## 2021-06-28 DIAGNOSIS — K5900 Constipation, unspecified: Secondary | ICD-10-CM | POA: Diagnosis not present

## 2021-06-28 DIAGNOSIS — Z1211 Encounter for screening for malignant neoplasm of colon: Secondary | ICD-10-CM | POA: Diagnosis not present

## 2021-06-28 NOTE — Progress Notes (Signed)
06/28/2021 Gloria Brooks [id number] May 29, 1956   CHIEF COMPLAINT:  Reflux, schedule EGD/colonoscopy   HISTORY OF PRESENT ILLNESS: Gloria Brooks is a 65 year old female with a past medical history of arthritis, hypertension, hyperlipidemia, asthma, diabetes mellitus type 2, GERD and IBS.  She presents to our office today as referred by Dr. Prince Solian for further evaluation regarding acid reflux and to schedule a screening colonoscopy. She complains of having increased acid reflux with LUQ pain for the past few weeks.  She developed left chest pain which radiated to her arm and she presented to the ED on 06/19/2021.  A twelve-lead EKG showed normal sinus rhythm without acute ischemia, nonspecific T wave abnormality was noted.  A chest x-ray was negative.  Troponin x 2 was negative. CBC, CMP and lipase results were normal.  Her symptoms were thought to be GI in etiology.  She was discharged home on Maalox 10 mils every 6 hours as needed with instructions to gradually a GI consult for possible EGD.  She apparently had an old prescription of Pantoprazole 40 mg which she started taking after her ED visit and she is having less heartburn and LUQ pain.  However, she continues to have left chest pain described as a pressure under her left breast which has occurred twice since her ED visit and lasts for less than 10 minutes.  Eating sometimes triggers the left chest pain and other times does not.  No obvious chest pain with activity.  No associated palpitations, dizziness or shortness of breath.  No known history of heart disease.  She complains of chronic constipation.  She passes a bowel movement once weekly.  She develops diarrhea if she takes a probiotic.  No rectal bleeding or black stools.  She underwent an EGD and colonoscopy by Dr. Earlean Shawl approximately 10 years ago which she reported were normal.  Maternal grandmother with history of colon cancer.  CBC Latest Ref Rng & Units 06/19/2021  09/22/2020 01/30/2013  WBC 4.0 - 10.5 K/uL 6.3 6.0 -  Hemoglobin 12.0 - 15.0 g/dL 12.5 13.5 13.3  Hematocrit 36.0 - 46.0 % 39.4 42.3 39.0  Platelets 150 - 400 K/uL 336 381 -    CMP Latest Ref Rng & Units 06/19/2021 09/22/2020 01/30/2013  Glucose 70 - 99 mg/dL 105(H) 85 149(H)  BUN 8 - 23 mg/dL 9 12 9   Creatinine 0.44 - 1.00 mg/dL 0.75 0.92 0.90  Sodium 135 - 145 mmol/L 138 142 142  Potassium 3.5 - 5.1 mmol/L 3.4(L) 4.2 3.6  Chloride 98 - 111 mmol/L 102 105 105  CO2 22 - 32 mmol/L 28 28 -  Calcium 8.9 - 10.3 mg/dL 9.1 9.6 -  Total Protein 6.5 - 8.1 g/dL 7.5 7.6 -  Total Bilirubin 0.3 - 1.2 mg/dL 0.4 0.6 -  Alkaline Phos 38 - 126 U/L 77 - -  AST 15 - 41 U/L 20 16 -  ALT 0 - 44 U/L 17 18 -  Lipase 30.   Past Medical History:  Diagnosis Date   Allergy    Arthritis    Asthma    Diabetes mellitus without complication (HCC)    Environmental allergies    GERD (gastroesophageal reflux disease)    Hyperlipidemia    Hypertension    IBS (irritable bowel syndrome)    Obesity    Past Surgical History:  Procedure Laterality Date   ABDOMINAL HYSTERECTOMY     complete   [place]   COLONOSCOPY  TUBAL LIGATION     Social History: She is divorced.  She has 2 sons.  She is a Dance movement psychotherapist.  Non-smoker.  No alcohol use.  No drug use.  Family History: family history includes Arthritis in her mother; Breast cancer in her cousin and maternal aunt; Colon cancer in her maternal grandmother; Diabetes in her father, sister, and sister; Healthy in her son and son; Heart disease in her father; Hyperlipidemia in her mother; Hypertension in her mother; Obesity in her sister; Stroke in her mother.  Allergies  Allergen Reactions   Shellfish Allergy Anaphylaxis   Naproxen Itching and Other (See Comments)    Associated with other pain meds as well   Codeine     Other reaction(s): Unknown   Food     Shrimp   Latex    Mucinex [Guaifenesin Er] Swelling    Pt reports throat  swelling   Other Swelling    Pt reports throat swelling      Outpatient Encounter Medications as of 06/28/2021  Medication Sig   alum & mag hydroxide-simeth (MAALOX MAX) 400-400-40 MG/5ML suspension Take 10 mLs by mouth every 6 (six) hours as needed for indigestion.   amLODipine-atorvastatin (CADUET) 5-20 MG per tablet Take 1 tablet by mouth daily.   Budesonide-Formoterol Fumarate (SYMBICORT IN) Inhale into the lungs as needed.    irbesartan (AVAPRO) 75 MG tablet Take 75 mg by mouth daily.   levocetirizine (XYZAL) 5 MG tablet as needed.   metFORMIN (GLUCOPHAGE-XR) 500 MG 24 hr tablet TAKE 1 TABLET BY MOUTH EVERY DAY   mometasone (NASONEX) 50 MCG/ACT nasal spray Place 2 sprays into the nose as needed.    montelukast (SINGULAIR) 10 MG tablet as needed.   Olopatadine HCl (PATADAY OP) Apply to eye as needed.   OZEMPIC, 1 MG/DOSE, 4 MG/3ML SOPN Inject into the skin.   RESTASIS 0.05 % ophthalmic emulsion as needed.   Facility-Administered Encounter Medications as of 06/28/2021  Medication   triamcinolone acetonide (KENALOG) 10 MG/ML injection 10 mg   REVIEW OF SYSTEMS: Gen: Denies fever, sweats or chills. No weight loss.  CV: Denies chest pain, palpitations or edema. Resp: Denies cough, shortness of breath of hemoptysis.  GI: Denies heartburn, dysphagia, stomach or lower abdominal pain. No diarrhea or constipation.  GU : Denies urinary burning, blood in urine, increased urinary frequency or incontinence. MS:+ Back pain. Derm: Denies rash, itchiness, skin lesions or unhealing ulcers. Psych: Denies depression, anxiety, memory loss, suicidal ideation and confusion. Heme: Denies bruising, bleeding. Neuro:  Denies headaches, dizziness or paresthesias. Endo: + DM II.   PHYSICAL EXAM: BP 106/60    Pulse 80    Ht 5\' 6"  (1.676 m)    Wt 159 lb (72.1 kg)    BMI 25.66 kg/m  General: 65 year old female in no acute distress. Head: Normocephalic and atraumatic. Eyes:  Sclerae non-icteric,  conjunctive pink. Ears: Normal auditory acuity. Mouth: Dentition intact. No ulcers or lesions.  Neck: Supple, no lymphadenopathy or thyromegaly.  Lungs: Clear bilaterally to auscultation without wheezes, crackles or rhonchi. Heart: Regular rate and rhythm. No murmur, rub or gallop appreciated.  Abdomen: Soft, nontender, non distended. No masses. No hepatosplenomegaly. Normoactive bowel sounds x 4 quadrants.  Rectal: Deferred. Musculoskeletal: Symmetrical with no gross deformities. Skin: Warm and dry. No rash or lesions on visible extremities. Extremities: No edema. Neurological: Alert oriented x 4, no focal deficits.  Psychological:  Alert and cooperative. Normal mood and affect.  ASSESSMENT AND PLAN:  73) 65 year old female  with acid reflux and burning LUQ pain -EGD benefits and risks discussed including risk with sedation, risk of bleeding, perforation and infection. EGD to be scheduled after cardiac clearance received. -GERD diet handout -Pantoprazole 40 mg daily  2) Constipation -MiraLAX nightly  3) Colon cancer screening.  Patient reported colonoscopy 10 years ago by Dr. Earlean Shawl was normal, no polyps.  Maternal grandmother with history of colon cancer. -Colonoscopy with benefits and risks discussed including risk with sedation, risk of bleeding, perforation and infection. Colonoscopy to be scheduled after cardiac clearance received   4) Atypical left chest pain. EKG in the ED without evidence of acute ischemia but showed nonspecific T wave abnormality.  Age, history of diabetes and HTN increases her risk for CAD. -Cardiology referral.  Requested cardiac clearance prior to scheduling EGD and colonoscopy.          CC:  Prince Solian, MD

## 2021-06-28 NOTE — Patient Instructions (Addendum)
If you are age 64 or younger, your body mass index should be between 19-25. Your Body mass index is 25.66 kg/m. If this is out of the aformentioned range listed, please consider follow up with your Primary Care Provider.   The [facility] providers would like to encourage you to use [facility] to communicate with providers for non-urgent requests or questions.  Due to long hold times on the telephone, sending your provider a message by [facility] may be faster and more efficient way to get a response. Please allow 48 business hours for a response.  Please remember that this is for non-urgent requests/questions.  We place placed a referral to Ambulatory referral to Cardiology  Where: [facility] Address: [address], [address] [place] [address] Phone: [phone].  You should hear from their office in the next couple weeks to schedule an appointment. If you do not hear from them please call them at [phone].  Once you have seen cardiology please contact us so we can obtain clearance to proceed with the colonoscopy/EGD.  Miralax- Dissolve one capful in 8 ounces of water and drink before bed.  It was great seeing you today! Thank you for entrusting me with your care and choosing [facility].  Arnaldo Natal, CRNP

## 2021-06-29 MED ORDER — PANTOPRAZOLE SODIUM 40 MG PO TBEC: 40.0000 mg | DELAYED_RELEASE_TABLET | Freq: Every day | ORAL | 1 refills | Status: AC

## 2021-07-04 DIAGNOSIS — J3089 Other allergic rhinitis: Secondary | ICD-10-CM | POA: Diagnosis not present

## 2021-07-04 DIAGNOSIS — J3081 Allergic rhinitis due to animal (cat) (dog) hair and dander: Secondary | ICD-10-CM | POA: Diagnosis not present

## 2021-07-04 DIAGNOSIS — J301 Allergic rhinitis due to pollen: Secondary | ICD-10-CM | POA: Diagnosis not present

## 2021-07-05 ENCOUNTER — Telehealth: Payer: Self-pay | Admitting: Nurse Practitioner

## 2021-07-05 ENCOUNTER — Encounter: Payer: Self-pay | Admitting: Cardiovascular Disease

## 2021-07-05 ENCOUNTER — Ambulatory Visit: Payer: Federal, State, Local not specified - PPO | Admitting: Cardiovascular Disease

## 2021-07-05 ENCOUNTER — Other Ambulatory Visit: Payer: Self-pay

## 2021-07-05 VITALS — BP 120/68 | HR 73 | Ht 66.0 in | Wt 157.0 lb

## 2021-07-05 DIAGNOSIS — I10 Essential (primary) hypertension: Secondary | ICD-10-CM | POA: Insufficient documentation

## 2021-07-05 DIAGNOSIS — E785 Hyperlipidemia, unspecified: Secondary | ICD-10-CM | POA: Insufficient documentation

## 2021-07-05 DIAGNOSIS — Z01810 Encounter for preprocedural cardiovascular examination: Secondary | ICD-10-CM

## 2021-07-05 DIAGNOSIS — E782 Mixed hyperlipidemia: Secondary | ICD-10-CM | POA: Diagnosis not present

## 2021-07-05 DIAGNOSIS — R0789 Other chest pain: Secondary | ICD-10-CM | POA: Insufficient documentation

## 2021-07-05 DIAGNOSIS — R072 Precordial pain: Secondary | ICD-10-CM | POA: Diagnosis not present

## 2021-07-05 DIAGNOSIS — Z01818 Encounter for other preprocedural examination: Secondary | ICD-10-CM

## 2021-07-05 MED ORDER — PREDNISONE 50 MG PO TABS: ORAL_TABLET | ORAL | 0 refills | Status: AC

## 2021-07-05 MED ORDER — METOPROLOL TARTRATE 100 MG PO TABS: 100.0000 mg | ORAL_TABLET | Freq: Once | ORAL | 0 refills | Status: AC

## 2021-07-05 NOTE — Patient Instructions (Addendum)
Medication Instructions:  Your physician recommends that you continue on your current medications as directed. Please refer to the Current Medication list given to you today.  *If you need a refill on your cardiac medications before your next appointment, please call your pharmacy*   Lab Work: Your physician recommends that you return for lab work in: within 7 days of coronary CTA- BMET  If you have labs (blood work) drawn today and your tests are completely normal, you will receive your results only by: [place] (if you have MyChart) OR A paper copy in the mail If you have any lab test that is abnormal or we need to change your treatment, we will call you to review the results.   Testing/Procedures: See below.   Follow-Up: At [facility], you and your health needs are our priority.  As part of our continuing mission to provide you with exceptional heart care, we have created designated Provider Care Teams.  These Care Teams include your primary Cardiologist (physician) and Advanced Practice Providers (APPs -  Physician Assistants and Nurse Practitioners) who all work together to provide you with the care you need, when you need it.  We recommend signing up for the patient portal called "MyChart".  Sign up information is provided on this After Visit Summary.  MyChart is used to connect with patients for Virtual Visits (Telemedicine).  Patients are able to view lab/test results, encounter notes, upcoming appointments, etc.  Non-urgent messages can be sent to your provider as well.   To learn more about what you can do with MyChart, go to [email or web address].    Your next appointment:   We will see you on an as needed basis.  Provider:   Quay Burow, MD   Other Instructions    Your cardiac CT will be scheduled at the below location:   [facility] [address] [place], [place] [address] [phone]   If scheduled at [facility],  please arrive at the [facility] main entrance (entrance A) of [facility] 30 minutes prior to test start time. You can use the FREE valet parking offered at the main entrance (encouraged to control the heart rate for the test) Proceed to the [facility] Radiology Department (first floor) to check-in and test prep.   Please follow these instructions carefully (unless otherwise directed):   On the Night Before the Test: Be sure to Drink plenty of water. Do not consume any caffeinated/decaffeinated beverages or chocolate 12 hours prior to your test. Do not take any antihistamines 12 hours prior to your test. If the patient has contrast allergy: Patient will need a prescription for Prednisone and very clear instructions (as follows): Prednisone 50 mg - take 13 hours prior to test Take another Prednisone 50 mg 7 hours prior to test Take another Prednisone 50 mg 1 hour prior to test Take Benadryl 50 mg 1 hour prior to test Patient must complete all four doses of above prophylactic medications. Patient will need a ride after test due to Benadryl.   On the Day of the Test: Drink plenty of water until 1 hour prior to the test. Do not eat any food 4 hours prior to the test. You may take your regular medications prior to the test.  Take metoprolol (Lopressor) 100mg  two hours prior to test. HOLD Furosemide/Hydrochlorothiazide morning of the test. FEMALES- please wear underwire-free bra if available, avoid dresses & tight clothing       After the Test: Drink  plenty of water. After receiving IV contrast, you may experience a mild flushed feeling. This is normal. On occasion, you may experience a mild rash up to 24 hours after the test. This is not dangerous. If this occurs, you can take Benadryl 25 mg and increase your fluid intake. If you experience trouble breathing, this can be serious. If it is severe call 911 IMMEDIATELY. If it is mild, please call our office. If you take any of  these medications: Glipizide/Metformin, Avandament, Glucavance, please do not take 48 hours after completing test unless otherwise instructed.  Please allow 2-4 weeks for scheduling of routine cardiac CTs. Some insurance companies require a pre-authorization which may delay scheduling of this test.   For non-scheduling related questions, please contact the cardiac imaging nurse navigator should you have any questions/concerns: Marchia Bond, Cardiac Imaging Nurse Navigator Gordy Clement, Cardiac Imaging Nurse Navigator [facility] Direct Office Dial: [phone]   For scheduling needs, including cancellations and rescheduling, please call [place], [phone].

## 2021-07-05 NOTE — Assessment & Plan Note (Signed)
History of hyperlipidemia on atorvastatin with lipid profile performed 05/02/2020 revealing total cholesterol 137, LDL of 83 and HDL 41.

## 2021-07-05 NOTE — Telephone Encounter (Signed)
Patient went to heart Dr. Charline Bills that they need clearance from [facility]. Seeking advice, please advise.   [phone]

## 2021-07-05 NOTE — Assessment & Plan Note (Signed)
Gloria Brooks has multiple cardiac risk factors.  She was seen in the emergency room on 06/19/2021 with epigastric and lower substernal chest pain.  Work-up was unremarkable.  Enzymes were negative.  She says that over the last 3 to 4 weeks she has had similar symptoms with occasional radiation to her left upper extremity.  She is being evaluated for GI etiology and was sent for preoperative clearance prior to EGD and colonoscopy.  I believe that given her risk factors and her new onset symptoms we will rule out an ischemic etiology prior to clearing her for her GI eval.

## 2021-07-05 NOTE — Telephone Encounter (Signed)
Patient seen by cardiology today. She will require further work up for her chest pain before clearance for GI procedures will be given per the office visit note.   Called the patient back. No answer. Left her a message of my call.

## 2021-07-05 NOTE — Progress Notes (Signed)
07/05/2021 Gloria Brooks   1955/08/22  [id number]  Primary Physician Prince Solian, MD Primary Cardiologist: Lorretta Harp MD Gloria Brooks, [place]  HPI:  Gloria Brooks is a 66 y.o. 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  She was referred by Carl Best NP for preoperative clearance prior to GI evaluation for epigastric pain.  Her primary care provider is Dr. Dagmar Hait.  Her cardiovascular risk factor profile is notable for treated hypertension, diabetes and hyperlipidemia.  Her father did die sudden death in his later years.  She is never had a heart attack or stroke.  She has had epigastric/lower substernal chest pain for last several weeks occurring spontaneously, lasting for minutes at a time with occasional left upper extremity radiation.  She did seek evaluation in the emergency room on 06/19/2021.  Her work-up was unrevealing at that time.  She was referred to gastroenterology who suggested that she have EGD/colonoscopy but referred her here for preoperative clearance to rule out an ischemic etiology first.   Current Meds  Medication Sig   alum & mag hydroxide-simeth (MAALOX MAX) 400-400-40 MG/5ML suspension Take 10 mLs by mouth every 6 (six) hours as needed for indigestion.   amLODipine-atorvastatin (CADUET) 5-20 MG per tablet Take 1 tablet by mouth daily.   Budesonide-Formoterol Fumarate (SYMBICORT IN) Inhale into the lungs as needed.    irbesartan (AVAPRO) 75 MG tablet Take 75 mg by mouth daily.   levocetirizine (XYZAL) 5 MG tablet as needed.   metFORMIN (GLUCOPHAGE-XR) 500 MG 24 hr tablet TAKE 1 TABLET BY MOUTH EVERY DAY   mometasone (NASONEX) 50 MCG/ACT nasal spray Place 2 sprays into the nose as needed.    montelukast (SINGULAIR) 10 MG tablet as needed.   Olopatadine HCl (PATADAY OP) Apply to eye as needed.   OZEMPIC, 1 MG/DOSE, 4 MG/3ML SOPN Inject into the skin.   pantoprazole (PROTONIX) 40 MG  tablet Take 1 tablet (40 mg total) by mouth daily.   RESTASIS 0.05 % ophthalmic emulsion as needed.   Current Facility-Administered Medications for the 07/05/21 encounter (Office Visit) with Lorretta Harp, MD  Medication   triamcinolone acetonide (KENALOG) 10 MG/ML injection 10 mg     Allergies  Allergen Reactions   Shellfish Allergy Anaphylaxis   Naproxen Itching and Other (See Comments)    Associated with other pain meds as well   Codeine     Other reaction(s): Unknown   Food     Shrimp   Latex    Mucinex [Guaifenesin Er] Swelling    Pt reports throat swelling   Other Swelling    Pt reports throat swelling    Social History   Socioeconomic History   Marital status: Divorced    Spouse name: N/A   Number of children: 2   Years of education: 17   Highest education level: Not on file  Occupational History   Occupation: Ship broker: [employer]  Tobacco Use   Smoking status: Never   Smokeless tobacco: Never  Vaping Use   Vaping Use: Never used  Substance and Sexual Activity   Alcohol use: No   Drug use: No   Sexual activity: Not Currently    Birth control/protection: Surgical    Comment: Hyterectomy  Other Topics Concern   Not on file  Social History Narrative   Not on file   Social Determinants of Health   Financial Resource Strain:  Not on file  Food Insecurity: Not on file  Transportation Needs: Not on file  Physical Activity: Not on file  Stress: Not on file  Social Connections: Not on file  Intimate Partner Violence: Not on file     Review of Systems: General: negative for chills, fever, night sweats or weight changes.  Cardiovascular: negative for chest pain, dyspnea on exertion, edema, orthopnea, palpitations, paroxysmal nocturnal dyspnea or shortness of breath Dermatological: negative for rash Respiratory: negative for cough or wheezing Urologic: negative for hematuria Abdominal: negative for nausea, vomiting, diarrhea,  bright red blood per rectum, melena, or hematemesis Neurologic: negative for visual changes, syncope, or dizziness All other systems reviewed and are otherwise negative except as noted above.    Blood pressure 120/68, pulse 73, height 5\' 6"  (1.676 m), weight 157 lb (71.2 kg), SpO2 99 %.  General appearance: alert and no distress Neck: no adenopathy, no carotid bruit, no JVD, supple, symmetrical, trachea midline, and thyroid not enlarged, symmetric, no tenderness/mass/nodules Lungs: clear to auscultation bilaterally Heart: regular rate and rhythm, S1, S2 normal, no murmur, click, rub or gallop Extremities: extremities normal, atraumatic, no cyanosis or edema Pulses: 2+ and symmetric Skin: Skin color, texture, turgor normal. No rashes or lesions Neurologic: Grossly normal  EKG sinus rhythm at 73 without ST or T wave changes.  I personally reviewed this EKG.  ASSESSMENT AND PLAN:   Essential hypertension History of essential hypertension with blood pressure measured today at 120/68.  She is on amlodipine and Avapro.  Hyperlipidemia History of hyperlipidemia on atorvastatin with lipid profile performed 05/02/2020 revealing total cholesterol 137, LDL of 83 and HDL 41.  Atypical chest pain Ms. Doelling has multiple cardiac risk factors.  She was seen in the emergency room on 06/19/2021 with epigastric and lower substernal chest pain.  Work-up was unremarkable.  Enzymes were negative.  She says that over the last 3 to 4 weeks she has had similar symptoms with occasional radiation to her left upper extremity.  She is being evaluated for GI etiology and was sent for preoperative clearance prior to EGD and colonoscopy.  I believe that given her risk factors and her new onset symptoms we will rule out an ischemic etiology prior to clearing her for her GI eval.     Lorretta Harp MD [facility], [facility] 07/05/2021 8:33 AM

## 2021-07-05 NOTE — Progress Notes (Signed)
Dr.Dorsey, refer to GI office visit 06/28/2021. FYI patient is not yet cleared by cardiology for procedure. Await official cardiac clearance prior to scheduling EGD and colonoscopy.

## 2021-07-05 NOTE — Assessment & Plan Note (Signed)
History of essential hypertension with blood pressure measured today at 120/68.  She is on amlodipine and Avapro.

## 2021-07-07 NOTE — Progress Notes (Signed)
I agree with the assessment and plan as outlined by Ms. Kennedy-Smith. 

## 2021-07-11 DIAGNOSIS — E782 Mixed hyperlipidemia: Secondary | ICD-10-CM | POA: Diagnosis not present

## 2021-07-11 DIAGNOSIS — R072 Precordial pain: Secondary | ICD-10-CM | POA: Diagnosis not present

## 2021-07-11 DIAGNOSIS — R0789 Other chest pain: Secondary | ICD-10-CM | POA: Diagnosis not present

## 2021-07-11 DIAGNOSIS — I10 Essential (primary) hypertension: Secondary | ICD-10-CM | POA: Diagnosis not present

## 2021-07-12 LAB — BASIC METABOLIC PANEL
BUN/Creatinine Ratio: 5 — ABNORMAL LOW (ref 12–28)
BUN: 5 mg/dL — ABNORMAL LOW (ref 8–27)
CO2: 23 mmol/L (ref 20–29)
Calcium: 9.4 mg/dL (ref 8.7–10.3)
Chloride: 103 mmol/L (ref 96–106)
Creatinine, Ser: 0.92 mg/dL (ref 0.57–1.00)
Glucose: 91 mg/dL (ref 70–99)
Potassium: 4 mmol/L (ref 3.5–5.2)
Sodium: 141 mmol/L (ref 134–144)
eGFR: 70 mL/min/{1.73_m2} (ref >59)

## 2021-07-16 ENCOUNTER — Ambulatory Visit (HOSPITAL_COMMUNITY): Admission: RE | Admit: 2021-07-16 | Payer: Federal, State, Local not specified - PPO | Source: Ambulatory Visit

## 2021-07-18 ENCOUNTER — Ambulatory Visit: Payer: Federal, State, Local not specified - PPO | Admitting: Gastroenterology

## 2021-07-20 ENCOUNTER — Ambulatory Visit: Payer: Federal, State, Local not specified - PPO | Admitting: Cardiology

## 2021-08-08 ENCOUNTER — Telehealth (HOSPITAL_COMMUNITY): Payer: Self-pay | Admitting: *Deleted

## 2021-08-08 ENCOUNTER — Telehealth (HOSPITAL_COMMUNITY): Payer: Self-pay | Admitting: Emergency Medicine

## 2021-08-08 NOTE — Telephone Encounter (Signed)
Attempted to call patient regarding upcoming cardiac CT appointment. °Left message on voicemail with name and callback number ° °Sienna Stonehocker RN Navigator Cardiac Imaging °[facility] °[phone] Office °[phone] Cell ° °

## 2021-08-08 NOTE — Telephone Encounter (Signed)
Reaching out to patient to offer assistance regarding upcoming cardiac imaging study; pt verbalizes understanding of appt date/time, parking situation and where to check in, pre-test NPO status and medications ordered, and verified current allergies; name and call back number provided for further questions should they arise Marchia Bond RN Navigator Cardiac Imaging [facility] [phone] office [phone] cell   Allergy to shellfish - never had contrast (not taking 13 hr prep) 100mg  metoprolol  Denies Iv issues Arrival 930

## 2021-08-10 ENCOUNTER — Other Ambulatory Visit: Payer: Self-pay

## 2021-08-10 ENCOUNTER — Ambulatory Visit (HOSPITAL_COMMUNITY)
Admission: RE | Admit: 2021-08-10 | Discharge: 2021-08-10 | Disposition: A | Discharge status: Home / Self Care | Payer: Federal, State, Local not specified - PPO | Source: Ambulatory Visit | Attending: Cardiovascular Disease | Admitting: Cardiovascular Disease

## 2021-08-10 DIAGNOSIS — R072 Precordial pain: Secondary | ICD-10-CM | POA: Insufficient documentation

## 2021-08-10 MED ORDER — NITROGLYCERIN 0.4 MG SL SUBL
SUBLINGUAL_TABLET | SUBLINGUAL | Status: AC
  Filled 2021-08-10: qty 2

## 2021-08-10 MED ORDER — IOHEXOL 350 MG/ML SOLN
100.0000 mL | Freq: Once | INTRAVENOUS | Status: AC | PRN
  Administered 2021-08-10: 100 mL via INTRAVENOUS

## 2021-08-10 MED ORDER — NITROGLYCERIN 0.4 MG SL SUBL
0.8000 mg | SUBLINGUAL_TABLET | Freq: Once | SUBLINGUAL | Status: AC
Start: 2021-08-10 — End: 2021-08-10
  Administered 2021-08-10: 0.8 mg via SUBLINGUAL

## 2021-08-14 ENCOUNTER — Telehealth: Payer: Self-pay | Admitting: Cardiovascular Disease

## 2021-08-14 NOTE — Telephone Encounter (Signed)
Forwarded to [facility]

## 2021-08-14 NOTE — Telephone Encounter (Signed)
Pt had appt in January and she had her CT 2-10 (result:  CCS 96. Non obstructive CAD  -Runell Gess, MD   08/11/2021 10:52 AM EST) Is pt cleared for EGD/Colonoscopy? Please advise

## 2021-08-14 NOTE — Telephone Encounter (Signed)
Returned call to pt to notify that she is cleared for EGD/Colonoscopy. She is asking if we received a clearance request? Notified pt that I noticed she was struggling to get cleared for procedure. Forwarded to JB and he states that she is cleared for procedure. Pt expresses thanks

## 2021-08-14 NOTE — Telephone Encounter (Signed)
Patient called states that [facility] is waiting on a clearance from [place].  She states that if can be faxed to [phone]

## 2021-08-24 ENCOUNTER — Telehealth: Payer: Self-pay | Admitting: Nurse Practitioner

## 2021-08-24 NOTE — Telephone Encounter (Signed)
This one was started in clinic. Sending it to [facility]

## 2021-08-24 NOTE — Telephone Encounter (Signed)
Inbound call from pt requesting a call back stating that she was cleared from her cardiologist and she ok for further GI procedures. Please advise. Thank you.

## 2021-08-27 NOTE — Telephone Encounter (Signed)
I will work on this at the end of the day when clinic is finished.

## 2021-08-29 DIAGNOSIS — J3081 Allergic rhinitis due to animal (cat) (dog) hair and dander: Secondary | ICD-10-CM | POA: Diagnosis not present

## 2021-08-29 DIAGNOSIS — J301 Allergic rhinitis due to pollen: Secondary | ICD-10-CM | POA: Diagnosis not present

## 2021-08-29 DIAGNOSIS — J3089 Other allergic rhinitis: Secondary | ICD-10-CM | POA: Diagnosis not present

## 2021-09-04 NOTE — Telephone Encounter (Signed)
LM for patient to call or send a Mychart message to get schedule for colon/EGD with Dr. Leonides Schanz. ?

## 2021-09-07 DIAGNOSIS — J3089 Other allergic rhinitis: Secondary | ICD-10-CM | POA: Diagnosis not present

## 2021-09-07 DIAGNOSIS — J301 Allergic rhinitis due to pollen: Secondary | ICD-10-CM | POA: Diagnosis not present

## 2021-09-07 DIAGNOSIS — J3081 Allergic rhinitis due to animal (cat) (dog) hair and dander: Secondary | ICD-10-CM | POA: Diagnosis not present

## 2021-09-07 NOTE — Telephone Encounter (Signed)
LM for patient

## 2021-09-14 DIAGNOSIS — J3089 Other allergic rhinitis: Secondary | ICD-10-CM | POA: Diagnosis not present

## 2021-09-14 DIAGNOSIS — J301 Allergic rhinitis due to pollen: Secondary | ICD-10-CM | POA: Diagnosis not present

## 2021-09-14 DIAGNOSIS — J3081 Allergic rhinitis due to animal (cat) (dog) hair and dander: Secondary | ICD-10-CM | POA: Diagnosis not present

## 2021-09-21 DIAGNOSIS — J3089 Other allergic rhinitis: Secondary | ICD-10-CM | POA: Diagnosis not present

## 2021-09-21 DIAGNOSIS — J301 Allergic rhinitis due to pollen: Secondary | ICD-10-CM | POA: Diagnosis not present

## 2021-09-21 DIAGNOSIS — J3081 Allergic rhinitis due to animal (cat) (dog) hair and dander: Secondary | ICD-10-CM | POA: Diagnosis not present

## 2021-09-23 DIAGNOSIS — S91302A Unspecified open wound, left foot, initial encounter: Secondary | ICD-10-CM | POA: Diagnosis not present

## 2021-09-23 DIAGNOSIS — E119 Type 2 diabetes mellitus without complications: Secondary | ICD-10-CM | POA: Diagnosis not present

## 2021-09-25 DIAGNOSIS — J301 Allergic rhinitis due to pollen: Secondary | ICD-10-CM | POA: Diagnosis not present

## 2021-09-25 DIAGNOSIS — J3081 Allergic rhinitis due to animal (cat) (dog) hair and dander: Secondary | ICD-10-CM | POA: Diagnosis not present

## 2021-09-25 DIAGNOSIS — J3089 Other allergic rhinitis: Secondary | ICD-10-CM | POA: Diagnosis not present

## 2021-09-26 DIAGNOSIS — J3089 Other allergic rhinitis: Secondary | ICD-10-CM | POA: Diagnosis not present

## 2021-09-26 DIAGNOSIS — J3081 Allergic rhinitis due to animal (cat) (dog) hair and dander: Secondary | ICD-10-CM | POA: Diagnosis not present

## 2021-09-26 DIAGNOSIS — J301 Allergic rhinitis due to pollen: Secondary | ICD-10-CM | POA: Diagnosis not present

## 2021-10-03 DIAGNOSIS — Z1211 Encounter for screening for malignant neoplasm of colon: Secondary | ICD-10-CM | POA: Diagnosis not present

## 2021-10-03 DIAGNOSIS — K219 Gastro-esophageal reflux disease without esophagitis: Secondary | ICD-10-CM | POA: Diagnosis not present

## 2021-10-03 DIAGNOSIS — R0789 Other chest pain: Secondary | ICD-10-CM | POA: Diagnosis not present

## 2021-10-03 DIAGNOSIS — R1013 Epigastric pain: Secondary | ICD-10-CM | POA: Diagnosis not present

## 2021-10-09 DIAGNOSIS — K219 Gastro-esophageal reflux disease without esophagitis: Secondary | ICD-10-CM | POA: Diagnosis not present

## 2021-10-09 DIAGNOSIS — Z1211 Encounter for screening for malignant neoplasm of colon: Secondary | ICD-10-CM | POA: Diagnosis not present

## 2021-10-09 DIAGNOSIS — K208 Other esophagitis without bleeding: Secondary | ICD-10-CM | POA: Diagnosis not present

## 2021-10-10 DIAGNOSIS — J3089 Other allergic rhinitis: Secondary | ICD-10-CM | POA: Diagnosis not present

## 2021-10-10 DIAGNOSIS — J3081 Allergic rhinitis due to animal (cat) (dog) hair and dander: Secondary | ICD-10-CM | POA: Diagnosis not present

## 2021-10-10 DIAGNOSIS — J301 Allergic rhinitis due to pollen: Secondary | ICD-10-CM | POA: Diagnosis not present

## 2021-10-17 DIAGNOSIS — J3089 Other allergic rhinitis: Secondary | ICD-10-CM | POA: Diagnosis not present

## 2021-10-17 DIAGNOSIS — J301 Allergic rhinitis due to pollen: Secondary | ICD-10-CM | POA: Diagnosis not present

## 2021-10-17 DIAGNOSIS — J3081 Allergic rhinitis due to animal (cat) (dog) hair and dander: Secondary | ICD-10-CM | POA: Diagnosis not present

## 2021-10-26 DIAGNOSIS — E1169 Type 2 diabetes mellitus with other specified complication: Secondary | ICD-10-CM | POA: Diagnosis not present

## 2021-10-26 DIAGNOSIS — I129 Hypertensive chronic kidney disease with stage 1 through stage 4 chronic kidney disease, or unspecified chronic kidney disease: Secondary | ICD-10-CM | POA: Diagnosis not present

## 2021-11-13 DIAGNOSIS — J3089 Other allergic rhinitis: Secondary | ICD-10-CM | POA: Diagnosis not present

## 2021-11-13 DIAGNOSIS — J301 Allergic rhinitis due to pollen: Secondary | ICD-10-CM | POA: Diagnosis not present

## 2021-11-13 DIAGNOSIS — J3081 Allergic rhinitis due to animal (cat) (dog) hair and dander: Secondary | ICD-10-CM | POA: Diagnosis not present

## 2021-11-21 DIAGNOSIS — J3081 Allergic rhinitis due to animal (cat) (dog) hair and dander: Secondary | ICD-10-CM | POA: Diagnosis not present

## 2021-11-21 DIAGNOSIS — J3089 Other allergic rhinitis: Secondary | ICD-10-CM | POA: Diagnosis not present

## 2021-11-21 DIAGNOSIS — J301 Allergic rhinitis due to pollen: Secondary | ICD-10-CM | POA: Diagnosis not present

## 2021-11-27 ENCOUNTER — Other Ambulatory Visit: Payer: Self-pay | Admitting: Internal Medicine

## 2021-11-27 DIAGNOSIS — J301 Allergic rhinitis due to pollen: Secondary | ICD-10-CM | POA: Diagnosis not present

## 2021-11-27 DIAGNOSIS — Z1231 Encounter for screening mammogram for malignant neoplasm of breast: Secondary | ICD-10-CM

## 2021-11-27 DIAGNOSIS — J3081 Allergic rhinitis due to animal (cat) (dog) hair and dander: Secondary | ICD-10-CM | POA: Diagnosis not present

## 2021-11-27 DIAGNOSIS — J3089 Other allergic rhinitis: Secondary | ICD-10-CM | POA: Diagnosis not present

## 2021-12-05 DIAGNOSIS — J3081 Allergic rhinitis due to animal (cat) (dog) hair and dander: Secondary | ICD-10-CM | POA: Diagnosis not present

## 2021-12-05 DIAGNOSIS — J3089 Other allergic rhinitis: Secondary | ICD-10-CM | POA: Diagnosis not present

## 2021-12-05 DIAGNOSIS — J301 Allergic rhinitis due to pollen: Secondary | ICD-10-CM | POA: Diagnosis not present

## 2021-12-13 DIAGNOSIS — J3089 Other allergic rhinitis: Secondary | ICD-10-CM | POA: Diagnosis not present

## 2021-12-13 DIAGNOSIS — J3081 Allergic rhinitis due to animal (cat) (dog) hair and dander: Secondary | ICD-10-CM | POA: Diagnosis not present

## 2021-12-13 DIAGNOSIS — J301 Allergic rhinitis due to pollen: Secondary | ICD-10-CM | POA: Diagnosis not present

## 2022-01-03 ENCOUNTER — Ambulatory Visit
Admission: RE | Admit: 2022-01-03 | Discharge: 2022-01-03 | Disposition: A | Discharge status: Home / Self Care | Payer: Federal, State, Local not specified - PPO | Source: Ambulatory Visit | Attending: Internal Medicine | Admitting: Internal Medicine

## 2022-01-03 DIAGNOSIS — Z1231 Encounter for screening mammogram for malignant neoplasm of breast: Secondary | ICD-10-CM

## 2022-02-26 IMAGING — DX DG CHEST 1V PORT
1 series · 1 of 1 positions shown · non-contrast
Comparison: August 24, 2018.

CLINICAL DATA: Chest pain with flu like symptoms in a 65-year-old
female.

EXAM:
PORTABLE CHEST 1 VIEW

[chest ap]
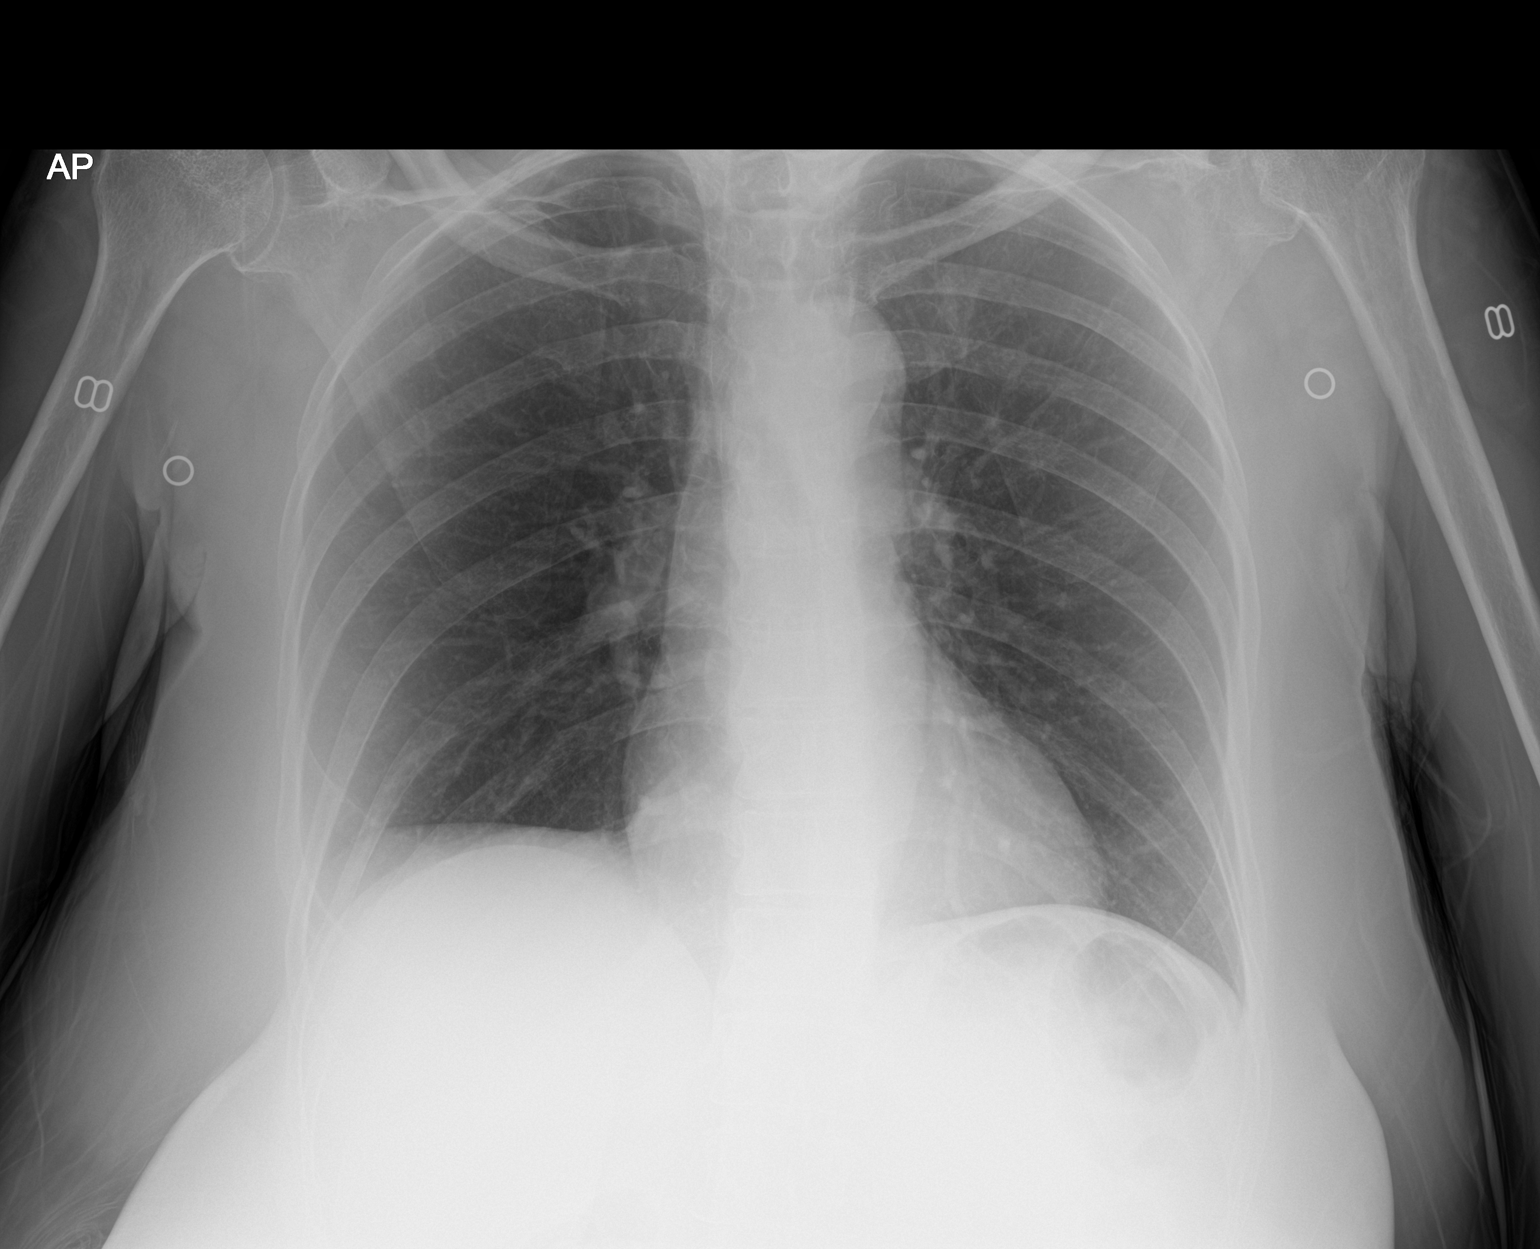

[1 of 1 positions shown; findings below may reference images not displayed]

FINDINGS: Trachea midline.

Cardiomediastinal contours and hilar structures are normal.

No signs of consolidation or evidence of pleural effusion on frontal
radiograph. No pneumothorax.

On limited assessment there is no acute skeletal process.
IMPRESSION: No acute cardiopulmonary disease.

## 2022-03-20 DIAGNOSIS — E1169 Type 2 diabetes mellitus with other specified complication: Secondary | ICD-10-CM | POA: Diagnosis not present

## 2022-05-13 DIAGNOSIS — J4540 Moderate persistent asthma, uncomplicated: Secondary | ICD-10-CM | POA: Diagnosis not present

## 2022-05-13 DIAGNOSIS — Z91013 Allergy to seafood: Secondary | ICD-10-CM | POA: Diagnosis not present

## 2022-05-13 DIAGNOSIS — J301 Allergic rhinitis due to pollen: Secondary | ICD-10-CM | POA: Diagnosis not present

## 2022-05-13 DIAGNOSIS — J3089 Other allergic rhinitis: Secondary | ICD-10-CM | POA: Diagnosis not present

## 2022-05-14 DIAGNOSIS — J3081 Allergic rhinitis due to animal (cat) (dog) hair and dander: Secondary | ICD-10-CM | POA: Diagnosis not present

## 2022-05-14 DIAGNOSIS — J3089 Other allergic rhinitis: Secondary | ICD-10-CM | POA: Diagnosis not present

## 2022-05-14 DIAGNOSIS — J301 Allergic rhinitis due to pollen: Secondary | ICD-10-CM | POA: Diagnosis not present

## 2022-05-16 DIAGNOSIS — E1169 Type 2 diabetes mellitus with other specified complication: Secondary | ICD-10-CM | POA: Diagnosis not present

## 2022-05-16 DIAGNOSIS — E559 Vitamin D deficiency, unspecified: Secondary | ICD-10-CM | POA: Diagnosis not present

## 2022-05-16 DIAGNOSIS — M810 Age-related osteoporosis without current pathological fracture: Secondary | ICD-10-CM | POA: Diagnosis not present

## 2022-05-22 DIAGNOSIS — Z0000 Encounter for general adult medical examination without abnormal findings: Secondary | ICD-10-CM | POA: Diagnosis not present

## 2022-05-22 DIAGNOSIS — I129 Hypertensive chronic kidney disease with stage 1 through stage 4 chronic kidney disease, or unspecified chronic kidney disease: Secondary | ICD-10-CM | POA: Diagnosis not present

## 2022-05-22 DIAGNOSIS — Z23 Encounter for immunization: Secondary | ICD-10-CM | POA: Diagnosis not present

## 2022-07-02 DIAGNOSIS — J3089 Other allergic rhinitis: Secondary | ICD-10-CM | POA: Diagnosis not present

## 2022-07-02 DIAGNOSIS — J3081 Allergic rhinitis due to animal (cat) (dog) hair and dander: Secondary | ICD-10-CM | POA: Diagnosis not present

## 2022-07-02 DIAGNOSIS — J301 Allergic rhinitis due to pollen: Secondary | ICD-10-CM | POA: Diagnosis not present

## 2022-07-17 DIAGNOSIS — J3081 Allergic rhinitis due to animal (cat) (dog) hair and dander: Secondary | ICD-10-CM | POA: Diagnosis not present

## 2022-07-17 DIAGNOSIS — J3089 Other allergic rhinitis: Secondary | ICD-10-CM | POA: Diagnosis not present

## 2022-07-17 DIAGNOSIS — J301 Allergic rhinitis due to pollen: Secondary | ICD-10-CM | POA: Diagnosis not present

## 2022-07-26 DIAGNOSIS — J3081 Allergic rhinitis due to animal (cat) (dog) hair and dander: Secondary | ICD-10-CM | POA: Diagnosis not present

## 2022-07-26 DIAGNOSIS — J3089 Other allergic rhinitis: Secondary | ICD-10-CM | POA: Diagnosis not present

## 2022-07-26 DIAGNOSIS — J301 Allergic rhinitis due to pollen: Secondary | ICD-10-CM | POA: Diagnosis not present

## 2022-08-09 DIAGNOSIS — R0981 Nasal congestion: Secondary | ICD-10-CM | POA: Diagnosis not present

## 2022-08-09 DIAGNOSIS — I129 Hypertensive chronic kidney disease with stage 1 through stage 4 chronic kidney disease, or unspecified chronic kidney disease: Secondary | ICD-10-CM | POA: Diagnosis not present

## 2022-08-09 DIAGNOSIS — R051 Acute cough: Secondary | ICD-10-CM | POA: Diagnosis not present

## 2022-08-09 DIAGNOSIS — J029 Acute pharyngitis, unspecified: Secondary | ICD-10-CM | POA: Diagnosis not present

## 2022-08-09 DIAGNOSIS — N182 Chronic kidney disease, stage 2 (mild): Secondary | ICD-10-CM | POA: Diagnosis not present

## 2022-08-09 DIAGNOSIS — Z1152 Encounter for screening for COVID-19: Secondary | ICD-10-CM | POA: Diagnosis not present

## 2022-08-09 DIAGNOSIS — J069 Acute upper respiratory infection, unspecified: Secondary | ICD-10-CM | POA: Diagnosis not present

## 2022-08-30 DIAGNOSIS — J3089 Other allergic rhinitis: Secondary | ICD-10-CM | POA: Diagnosis not present

## 2022-08-30 DIAGNOSIS — J301 Allergic rhinitis due to pollen: Secondary | ICD-10-CM | POA: Diagnosis not present

## 2022-08-30 DIAGNOSIS — J3081 Allergic rhinitis due to animal (cat) (dog) hair and dander: Secondary | ICD-10-CM | POA: Diagnosis not present

## 2022-09-06 DIAGNOSIS — J301 Allergic rhinitis due to pollen: Secondary | ICD-10-CM | POA: Diagnosis not present

## 2022-09-06 DIAGNOSIS — J3089 Other allergic rhinitis: Secondary | ICD-10-CM | POA: Diagnosis not present

## 2022-09-06 DIAGNOSIS — J3081 Allergic rhinitis due to animal (cat) (dog) hair and dander: Secondary | ICD-10-CM | POA: Diagnosis not present

## 2022-09-11 DIAGNOSIS — J3081 Allergic rhinitis due to animal (cat) (dog) hair and dander: Secondary | ICD-10-CM | POA: Diagnosis not present

## 2022-09-11 DIAGNOSIS — J3089 Other allergic rhinitis: Secondary | ICD-10-CM | POA: Diagnosis not present

## 2022-09-11 DIAGNOSIS — J301 Allergic rhinitis due to pollen: Secondary | ICD-10-CM | POA: Diagnosis not present

## 2022-09-19 DIAGNOSIS — J301 Allergic rhinitis due to pollen: Secondary | ICD-10-CM | POA: Diagnosis not present

## 2022-09-19 DIAGNOSIS — J3089 Other allergic rhinitis: Secondary | ICD-10-CM | POA: Diagnosis not present

## 2022-09-19 DIAGNOSIS — J3081 Allergic rhinitis due to animal (cat) (dog) hair and dander: Secondary | ICD-10-CM | POA: Diagnosis not present

## 2022-09-25 DIAGNOSIS — E669 Obesity, unspecified: Secondary | ICD-10-CM | POA: Diagnosis not present

## 2022-09-25 DIAGNOSIS — I129 Hypertensive chronic kidney disease with stage 1 through stage 4 chronic kidney disease, or unspecified chronic kidney disease: Secondary | ICD-10-CM | POA: Diagnosis not present

## 2022-09-25 DIAGNOSIS — N182 Chronic kidney disease, stage 2 (mild): Secondary | ICD-10-CM | POA: Diagnosis not present

## 2022-09-25 DIAGNOSIS — D649 Anemia, unspecified: Secondary | ICD-10-CM | POA: Diagnosis not present

## 2022-09-25 DIAGNOSIS — E1169 Type 2 diabetes mellitus with other specified complication: Secondary | ICD-10-CM | POA: Diagnosis not present

## 2022-09-25 DIAGNOSIS — E785 Hyperlipidemia, unspecified: Secondary | ICD-10-CM | POA: Diagnosis not present

## 2022-09-26 DIAGNOSIS — J301 Allergic rhinitis due to pollen: Secondary | ICD-10-CM | POA: Diagnosis not present

## 2022-09-26 DIAGNOSIS — J3089 Other allergic rhinitis: Secondary | ICD-10-CM | POA: Diagnosis not present

## 2022-09-26 DIAGNOSIS — J3081 Allergic rhinitis due to animal (cat) (dog) hair and dander: Secondary | ICD-10-CM | POA: Diagnosis not present

## 2022-10-03 DIAGNOSIS — J301 Allergic rhinitis due to pollen: Secondary | ICD-10-CM | POA: Diagnosis not present

## 2022-10-03 DIAGNOSIS — J3089 Other allergic rhinitis: Secondary | ICD-10-CM | POA: Diagnosis not present

## 2022-10-03 DIAGNOSIS — J3081 Allergic rhinitis due to animal (cat) (dog) hair and dander: Secondary | ICD-10-CM | POA: Diagnosis not present

## 2022-10-09 DIAGNOSIS — J3081 Allergic rhinitis due to animal (cat) (dog) hair and dander: Secondary | ICD-10-CM | POA: Diagnosis not present

## 2022-10-09 DIAGNOSIS — J301 Allergic rhinitis due to pollen: Secondary | ICD-10-CM | POA: Diagnosis not present

## 2022-10-09 DIAGNOSIS — J3089 Other allergic rhinitis: Secondary | ICD-10-CM | POA: Diagnosis not present

## 2022-10-18 DIAGNOSIS — J3089 Other allergic rhinitis: Secondary | ICD-10-CM | POA: Diagnosis not present

## 2022-10-18 DIAGNOSIS — J301 Allergic rhinitis due to pollen: Secondary | ICD-10-CM | POA: Diagnosis not present

## 2022-10-18 DIAGNOSIS — J3081 Allergic rhinitis due to animal (cat) (dog) hair and dander: Secondary | ICD-10-CM | POA: Diagnosis not present

## 2022-10-25 DIAGNOSIS — J301 Allergic rhinitis due to pollen: Secondary | ICD-10-CM | POA: Diagnosis not present

## 2022-10-25 DIAGNOSIS — J3089 Other allergic rhinitis: Secondary | ICD-10-CM | POA: Diagnosis not present

## 2022-10-25 DIAGNOSIS — J3081 Allergic rhinitis due to animal (cat) (dog) hair and dander: Secondary | ICD-10-CM | POA: Diagnosis not present

## 2022-11-13 DIAGNOSIS — J3081 Allergic rhinitis due to animal (cat) (dog) hair and dander: Secondary | ICD-10-CM | POA: Diagnosis not present

## 2022-11-13 DIAGNOSIS — E119 Type 2 diabetes mellitus without complications: Secondary | ICD-10-CM | POA: Diagnosis not present

## 2022-11-13 DIAGNOSIS — J3089 Other allergic rhinitis: Secondary | ICD-10-CM | POA: Diagnosis not present

## 2022-11-13 DIAGNOSIS — H1045 Other chronic allergic conjunctivitis: Secondary | ICD-10-CM | POA: Diagnosis not present

## 2022-11-13 DIAGNOSIS — J301 Allergic rhinitis due to pollen: Secondary | ICD-10-CM | POA: Diagnosis not present

## 2022-11-20 ENCOUNTER — Other Ambulatory Visit: Payer: Self-pay | Admitting: Internal Medicine

## 2022-11-20 DIAGNOSIS — Z1231 Encounter for screening mammogram for malignant neoplasm of breast: Secondary | ICD-10-CM

## 2022-11-20 DIAGNOSIS — J3089 Other allergic rhinitis: Secondary | ICD-10-CM | POA: Diagnosis not present

## 2022-11-20 DIAGNOSIS — J3081 Allergic rhinitis due to animal (cat) (dog) hair and dander: Secondary | ICD-10-CM | POA: Diagnosis not present

## 2022-11-20 DIAGNOSIS — J301 Allergic rhinitis due to pollen: Secondary | ICD-10-CM | POA: Diagnosis not present

## 2022-11-26 DIAGNOSIS — J3081 Allergic rhinitis due to animal (cat) (dog) hair and dander: Secondary | ICD-10-CM | POA: Diagnosis not present

## 2022-11-26 DIAGNOSIS — J3089 Other allergic rhinitis: Secondary | ICD-10-CM | POA: Diagnosis not present

## 2022-11-26 DIAGNOSIS — J301 Allergic rhinitis due to pollen: Secondary | ICD-10-CM | POA: Diagnosis not present

## 2022-12-13 DIAGNOSIS — J3081 Allergic rhinitis due to animal (cat) (dog) hair and dander: Secondary | ICD-10-CM | POA: Diagnosis not present

## 2022-12-13 DIAGNOSIS — J301 Allergic rhinitis due to pollen: Secondary | ICD-10-CM | POA: Diagnosis not present

## 2022-12-13 DIAGNOSIS — J3089 Other allergic rhinitis: Secondary | ICD-10-CM | POA: Diagnosis not present

## 2022-12-25 DIAGNOSIS — J301 Allergic rhinitis due to pollen: Secondary | ICD-10-CM | POA: Diagnosis not present

## 2022-12-25 DIAGNOSIS — J3081 Allergic rhinitis due to animal (cat) (dog) hair and dander: Secondary | ICD-10-CM | POA: Diagnosis not present

## 2022-12-25 DIAGNOSIS — J3089 Other allergic rhinitis: Secondary | ICD-10-CM | POA: Diagnosis not present

## 2022-12-31 DIAGNOSIS — J3081 Allergic rhinitis due to animal (cat) (dog) hair and dander: Secondary | ICD-10-CM | POA: Diagnosis not present

## 2022-12-31 DIAGNOSIS — J301 Allergic rhinitis due to pollen: Secondary | ICD-10-CM | POA: Diagnosis not present

## 2022-12-31 DIAGNOSIS — J3089 Other allergic rhinitis: Secondary | ICD-10-CM | POA: Diagnosis not present

## 2023-01-10 ENCOUNTER — Ambulatory Visit: Admission: RE | Admit: 2023-01-10 | Payer: Federal, State, Local not specified - PPO | Source: Ambulatory Visit

## 2023-01-10 DIAGNOSIS — Z1231 Encounter for screening mammogram for malignant neoplasm of breast: Secondary | ICD-10-CM | POA: Diagnosis not present

## 2023-01-22 DIAGNOSIS — H6591 Unspecified nonsuppurative otitis media, right ear: Secondary | ICD-10-CM | POA: Diagnosis not present

## 2023-01-31 DIAGNOSIS — H16223 Keratoconjunctivitis sicca, not specified as Sjogren's, bilateral: Secondary | ICD-10-CM | POA: Diagnosis not present

## 2023-02-07 DIAGNOSIS — J3089 Other allergic rhinitis: Secondary | ICD-10-CM | POA: Diagnosis not present

## 2023-02-07 DIAGNOSIS — J301 Allergic rhinitis due to pollen: Secondary | ICD-10-CM | POA: Diagnosis not present

## 2023-02-07 DIAGNOSIS — J3081 Allergic rhinitis due to animal (cat) (dog) hair and dander: Secondary | ICD-10-CM | POA: Diagnosis not present

## 2023-02-19 DIAGNOSIS — J301 Allergic rhinitis due to pollen: Secondary | ICD-10-CM | POA: Diagnosis not present

## 2023-02-19 DIAGNOSIS — J3089 Other allergic rhinitis: Secondary | ICD-10-CM | POA: Diagnosis not present

## 2023-02-19 DIAGNOSIS — J3081 Allergic rhinitis due to animal (cat) (dog) hair and dander: Secondary | ICD-10-CM | POA: Diagnosis not present

## 2023-02-28 DIAGNOSIS — J3089 Other allergic rhinitis: Secondary | ICD-10-CM | POA: Diagnosis not present

## 2023-02-28 DIAGNOSIS — J301 Allergic rhinitis due to pollen: Secondary | ICD-10-CM | POA: Diagnosis not present

## 2023-02-28 DIAGNOSIS — J3081 Allergic rhinitis due to animal (cat) (dog) hair and dander: Secondary | ICD-10-CM | POA: Diagnosis not present

## 2023-03-05 DIAGNOSIS — R202 Paresthesia of skin: Secondary | ICD-10-CM | POA: Diagnosis not present

## 2023-03-05 DIAGNOSIS — L219 Seborrheic dermatitis, unspecified: Secondary | ICD-10-CM | POA: Diagnosis not present

## 2023-03-05 DIAGNOSIS — J3089 Other allergic rhinitis: Secondary | ICD-10-CM | POA: Diagnosis not present

## 2023-03-05 DIAGNOSIS — J301 Allergic rhinitis due to pollen: Secondary | ICD-10-CM | POA: Diagnosis not present

## 2023-03-05 DIAGNOSIS — J3081 Allergic rhinitis due to animal (cat) (dog) hair and dander: Secondary | ICD-10-CM | POA: Diagnosis not present

## 2023-03-05 DIAGNOSIS — L853 Xerosis cutis: Secondary | ICD-10-CM | POA: Diagnosis not present

## 2023-03-06 DIAGNOSIS — H9313 Tinnitus, bilateral: Secondary | ICD-10-CM | POA: Diagnosis not present

## 2023-03-06 DIAGNOSIS — H608X3 Other otitis externa, bilateral: Secondary | ICD-10-CM | POA: Diagnosis not present

## 2023-03-06 DIAGNOSIS — H6121 Impacted cerumen, right ear: Secondary | ICD-10-CM | POA: Diagnosis not present

## 2023-03-06 DIAGNOSIS — H903 Sensorineural hearing loss, bilateral: Secondary | ICD-10-CM | POA: Diagnosis not present

## 2023-03-07 DIAGNOSIS — E1169 Type 2 diabetes mellitus with other specified complication: Secondary | ICD-10-CM | POA: Diagnosis not present

## 2023-03-07 DIAGNOSIS — I129 Hypertensive chronic kidney disease with stage 1 through stage 4 chronic kidney disease, or unspecified chronic kidney disease: Secondary | ICD-10-CM | POA: Diagnosis not present

## 2023-03-07 DIAGNOSIS — Z23 Encounter for immunization: Secondary | ICD-10-CM | POA: Diagnosis not present

## 2023-04-04 DIAGNOSIS — J3081 Allergic rhinitis due to animal (cat) (dog) hair and dander: Secondary | ICD-10-CM | POA: Diagnosis not present

## 2023-04-04 DIAGNOSIS — J3089 Other allergic rhinitis: Secondary | ICD-10-CM | POA: Diagnosis not present

## 2023-04-04 DIAGNOSIS — J301 Allergic rhinitis due to pollen: Secondary | ICD-10-CM | POA: Diagnosis not present

## 2023-05-12 DIAGNOSIS — J3089 Other allergic rhinitis: Secondary | ICD-10-CM | POA: Diagnosis not present

## 2023-05-12 DIAGNOSIS — J301 Allergic rhinitis due to pollen: Secondary | ICD-10-CM | POA: Diagnosis not present

## 2023-05-12 DIAGNOSIS — Z91013 Allergy to seafood: Secondary | ICD-10-CM | POA: Diagnosis not present

## 2023-05-12 DIAGNOSIS — J4540 Moderate persistent asthma, uncomplicated: Secondary | ICD-10-CM | POA: Diagnosis not present

## 2023-05-30 DIAGNOSIS — E1169 Type 2 diabetes mellitus with other specified complication: Secondary | ICD-10-CM | POA: Diagnosis not present

## 2023-05-30 DIAGNOSIS — N182 Chronic kidney disease, stage 2 (mild): Secondary | ICD-10-CM | POA: Diagnosis not present

## 2023-05-30 DIAGNOSIS — I129 Hypertensive chronic kidney disease with stage 1 through stage 4 chronic kidney disease, or unspecified chronic kidney disease: Secondary | ICD-10-CM | POA: Diagnosis not present

## 2023-06-04 DIAGNOSIS — E1169 Type 2 diabetes mellitus with other specified complication: Secondary | ICD-10-CM | POA: Diagnosis not present

## 2023-06-04 DIAGNOSIS — Z1331 Encounter for screening for depression: Secondary | ICD-10-CM | POA: Diagnosis not present

## 2023-06-04 DIAGNOSIS — I129 Hypertensive chronic kidney disease with stage 1 through stage 4 chronic kidney disease, or unspecified chronic kidney disease: Secondary | ICD-10-CM | POA: Diagnosis not present

## 2023-06-04 DIAGNOSIS — Z0000 Encounter for general adult medical examination without abnormal findings: Secondary | ICD-10-CM | POA: Diagnosis not present

## 2023-06-04 DIAGNOSIS — R0789 Other chest pain: Secondary | ICD-10-CM | POA: Diagnosis not present

## 2023-06-04 DIAGNOSIS — Z1339 Encounter for screening examination for other mental health and behavioral disorders: Secondary | ICD-10-CM | POA: Diagnosis not present

## 2023-06-04 DIAGNOSIS — R82998 Other abnormal findings in urine: Secondary | ICD-10-CM | POA: Diagnosis not present

## 2023-06-04 DIAGNOSIS — Z23 Encounter for immunization: Secondary | ICD-10-CM | POA: Diagnosis not present

## 2023-11-17 ENCOUNTER — Ambulatory Visit: Payer: Self-pay

## 2023-12-10 ENCOUNTER — Other Ambulatory Visit: Payer: Self-pay | Admitting: Internal Medicine

## 2023-12-10 DIAGNOSIS — Z1231 Encounter for screening mammogram for malignant neoplasm of breast: Secondary | ICD-10-CM

## 2023-12-19 ENCOUNTER — Ambulatory Visit

## 2023-12-23 ENCOUNTER — Other Ambulatory Visit (HOSPITAL_BASED_OUTPATIENT_CLINIC_OR_DEPARTMENT_OTHER): Payer: Self-pay

## 2024-01-16 ENCOUNTER — Other Ambulatory Visit: Payer: Self-pay | Admitting: Medical Genetics

## 2024-01-19 ENCOUNTER — Ambulatory Visit

## 2024-01-22 ENCOUNTER — Ambulatory Visit

## 2024-01-23 ENCOUNTER — Other Ambulatory Visit (HOSPITAL_COMMUNITY)
Admission: RE | Admit: 2024-01-23 | Discharge: 2024-01-23 | Disposition: A | Discharge status: Home / Self Care | Payer: Self-pay | Source: Ambulatory Visit | Attending: Medical Genetics | Admitting: Medical Genetics

## 2024-01-23 ENCOUNTER — Ambulatory Visit
Admission: RE | Admit: 2024-01-23 | Discharge: 2024-01-23 | Disposition: A | Discharge status: Home / Self Care | Source: Ambulatory Visit | Attending: Internal Medicine | Admitting: Internal Medicine

## 2024-01-23 DIAGNOSIS — Z1231 Encounter for screening mammogram for malignant neoplasm of breast: Secondary | ICD-10-CM

## 2024-02-06 LAB — GENECONNECT MOLECULAR SCREEN: Genetic Analysis Overall Interpretation: NEGATIVE

## 2024-02-13 ENCOUNTER — Encounter (HOSPITAL_COMMUNITY): Payer: Self-pay

## 2024-02-13 ENCOUNTER — Other Ambulatory Visit: Payer: Self-pay

## 2024-02-13 ENCOUNTER — Emergency Department (HOSPITAL_COMMUNITY)

## 2024-02-13 ENCOUNTER — Emergency Department (HOSPITAL_COMMUNITY): Admission: EM | Admit: 2024-02-13 | Discharge: 2024-02-13 | Disposition: A | Discharge status: Home / Self Care

## 2024-02-13 DIAGNOSIS — Z7984 Long term (current) use of oral hypoglycemic drugs: Secondary | ICD-10-CM | POA: Diagnosis not present

## 2024-02-13 DIAGNOSIS — S3991XA Unspecified injury of abdomen, initial encounter: Secondary | ICD-10-CM | POA: Insufficient documentation

## 2024-02-13 DIAGNOSIS — S0990XA Unspecified injury of head, initial encounter: Secondary | ICD-10-CM | POA: Diagnosis present

## 2024-02-13 DIAGNOSIS — Z79899 Other long term (current) drug therapy: Secondary | ICD-10-CM | POA: Diagnosis not present

## 2024-02-13 DIAGNOSIS — Z91040 Latex allergy status: Secondary | ICD-10-CM | POA: Diagnosis not present

## 2024-02-13 DIAGNOSIS — S060X0A Concussion without loss of consciousness, initial encounter: Secondary | ICD-10-CM | POA: Diagnosis not present

## 2024-02-13 DIAGNOSIS — M549 Dorsalgia, unspecified: Secondary | ICD-10-CM | POA: Diagnosis not present

## 2024-02-13 DIAGNOSIS — I10 Essential (primary) hypertension: Secondary | ICD-10-CM | POA: Insufficient documentation

## 2024-02-13 DIAGNOSIS — Y92410 Unspecified street and highway as the place of occurrence of the external cause: Secondary | ICD-10-CM | POA: Diagnosis not present

## 2024-02-13 DIAGNOSIS — Z794 Long term (current) use of insulin: Secondary | ICD-10-CM | POA: Diagnosis not present

## 2024-02-13 DIAGNOSIS — S299XXA Unspecified injury of thorax, initial encounter: Secondary | ICD-10-CM | POA: Insufficient documentation

## 2024-02-13 DIAGNOSIS — E119 Type 2 diabetes mellitus without complications: Secondary | ICD-10-CM | POA: Insufficient documentation

## 2024-02-13 LAB — COMPREHENSIVE METABOLIC PANEL WITH GFR
ALT: 22 U/L (ref 0–44)
AST: 23 U/L (ref 15–41)
Albumin: 3.5 g/dL (ref 3.5–5.0)
Alkaline Phosphatase: 68 U/L (ref 38–126)
Anion gap: 8 (ref 5–15)
BUN: 9 mg/dL (ref 8–23)
CO2: 24 mmol/L (ref 22–32)
Calcium: 9 mg/dL (ref 8.9–10.3)
Chloride: 106 mmol/L (ref 98–111)
Creatinine, Ser: 0.9 mg/dL (ref 0.44–1.00)
GFR, Estimated: >60 mL/min (ref >60)
Glucose, Bld: 100 mg/dL — ABNORMAL HIGH (ref 70–99)
Potassium: 4 mmol/L (ref 3.5–5.1)
Sodium: 138 mmol/L (ref 135–145)
Total Bilirubin: 1 mg/dL (ref 0.0–1.2)
Total Protein: 7.1 g/dL (ref 6.5–8.1)

## 2024-02-13 LAB — CBC
HCT: 37.7 % (ref 36.0–46.0)
Hemoglobin: 12.1 g/dL (ref 12.0–15.0)
MCH: 27.3 pg (ref 26.0–34.0)
MCHC: 32.1 g/dL (ref 30.0–36.0)
MCV: 85.1 fL (ref 80.0–100.0)
Platelets: 387 K/uL (ref 150–400)
RBC: 4.43 MIL/uL (ref 3.87–5.11)
RDW: 14.1 % (ref 11.5–15.5)
WBC: 11.2 K/uL — ABNORMAL HIGH (ref 4.0–10.5)
nRBC: 0 % (ref 0.0–0.2)

## 2024-02-13 LAB — I-STAT CG4 LACTIC ACID, ED: Lactic Acid, Venous: 1.3 mmol/L (ref 0.5–1.9)

## 2024-02-13 MED ORDER — LIDOCAINE 5 % EX PTCH: 1.0000 | MEDICATED_PATCH | CUTANEOUS | 0 refills | Status: AC

## 2024-02-13 MED ORDER — OXYCODONE-ACETAMINOPHEN 5-325 MG PO TABS
1.0000 | ORAL_TABLET | Freq: Once | ORAL | Status: AC
  Administered 2024-02-13: 1 via ORAL
  Filled 2024-02-13: qty 1

## 2024-02-13 MED ORDER — IOHEXOL 350 MG/ML SOLN
75.0000 mL | Freq: Once | INTRAVENOUS | Status: AC | PRN
  Administered 2024-02-13: 75 mL via INTRAVENOUS

## 2024-02-13 MED ORDER — DIPHENHYDRAMINE HCL 50 MG/ML IJ SOLN
25.0000 mg | Freq: Once | INTRAMUSCULAR | Status: AC
  Administered 2024-02-13: 25 mg via INTRAVENOUS
  Filled 2024-02-13: qty 1

## 2024-02-13 MED ORDER — METHOCARBAMOL 500 MG PO TABS: 500.0000 mg | ORAL_TABLET | Freq: Two times a day (BID) | ORAL | 0 refills | Status: AC

## 2024-02-13 NOTE — Discharge Instructions (Addendum)
 Please follow-up with your primary doctor.  Take Tylenol  every 6 hours for baseline pain control.  You may also use lidocaine  patches and muscle relaxers we are prescribing you.  Return felt fevers, chills, severe headache, vision loss, facial droop, chest pain, shortness of breath inability eat or drink due to nausea vomiting or any new or worsening symptoms that are concerning to you.
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
